# Patient Record
Sex: Male | Born: 1955 | Race: White | Hispanic: No | Marital: Married | Smoking: Never smoker
Health system: Southern US, Community
[De-identification: ages and names within clinical notes are randomized; demographics above are authoritative.]

## PROBLEM LIST (undated history)

## (undated) DIAGNOSIS — E785 Hyperlipidemia, unspecified: Secondary | ICD-10-CM

## (undated) DIAGNOSIS — J45990 Exercise induced bronchospasm: Secondary | ICD-10-CM

## (undated) DIAGNOSIS — R739 Hyperglycemia, unspecified: Secondary | ICD-10-CM

## (undated) DIAGNOSIS — Z8619 Personal history of other infectious and parasitic diseases: Secondary | ICD-10-CM

## (undated) HISTORY — DX: Hyperglycemia, unspecified: R73.9

## (undated) HISTORY — DX: Exercise induced bronchospasm: J45.990

## (undated) HISTORY — DX: Hyperlipidemia, unspecified: E78.5

## (undated) HISTORY — DX: Personal history of other infectious and parasitic diseases: Z86.19

## (undated) HISTORY — PX: TONSILLECTOMY: SHX5217

---

## 2015-11-08 DIAGNOSIS — B029 Zoster without complications: Secondary | ICD-10-CM | POA: Diagnosis not present

## 2016-03-26 DIAGNOSIS — S53492A Other sprain of left elbow, initial encounter: Secondary | ICD-10-CM | POA: Diagnosis not present

## 2016-08-18 DIAGNOSIS — M545 Low back pain: Secondary | ICD-10-CM | POA: Diagnosis not present

## 2017-07-28 DIAGNOSIS — M62838 Other muscle spasm: Secondary | ICD-10-CM | POA: Diagnosis not present

## 2017-08-15 DIAGNOSIS — M545 Low back pain: Secondary | ICD-10-CM | POA: Diagnosis not present

## 2017-08-16 ENCOUNTER — Telehealth: Payer: Self-pay | Admitting: Family Medicine

## 2017-08-16 NOTE — Telephone Encounter (Signed)
Copied from CRM [id number]. Topic: Appointment Scheduling - Scheduling Inquiry for Clinic >> Aug 16, 2017  9:22 AM Arlyss Gandyichardson, Taren N, NT wrote: Reason for CRM: Patient calling to see if Dr. Abner GreenspanBlyth will accept him as a new patient. He was referred by his neighbor, Reuban Romaro. Please advise.

## 2017-08-16 NOTE — Telephone Encounter (Signed)
I will accept as new patient

## 2017-08-17 NOTE — Telephone Encounter (Signed)
Pt scheduled for 10/15/17 @ 10:45pm

## 2017-08-20 ENCOUNTER — Ambulatory Visit (HOSPITAL_BASED_OUTPATIENT_CLINIC_OR_DEPARTMENT_OTHER)
Admission: RE | Admit: 2017-08-20 | Discharge: 2017-08-20 | Disposition: A | Discharge status: Home / Self Care | Payer: BLUE CROSS/BLUE SHIELD | Source: Ambulatory Visit | Attending: Medical | Admitting: Medical

## 2017-08-20 ENCOUNTER — Encounter: Payer: Self-pay | Admitting: Medical

## 2017-08-20 ENCOUNTER — Ambulatory Visit: Payer: BLUE CROSS/BLUE SHIELD | Admitting: Medical

## 2017-08-20 VITALS — BP 133/92 | HR 107 | Temp 98.2°F | Resp 16 | Ht 70.5 in | Wt 217.2 lb

## 2017-08-20 DIAGNOSIS — M5116 Intervertebral disc disorders with radiculopathy, lumbar region: Secondary | ICD-10-CM | POA: Diagnosis not present

## 2017-08-20 DIAGNOSIS — M5441 Lumbago with sciatica, right side: Secondary | ICD-10-CM | POA: Diagnosis not present

## 2017-08-20 DIAGNOSIS — R030 Elevated blood-pressure reading, without diagnosis of hypertension: Secondary | ICD-10-CM | POA: Diagnosis not present

## 2017-08-20 DIAGNOSIS — M25561 Pain in right knee: Secondary | ICD-10-CM | POA: Insufficient documentation

## 2017-08-20 DIAGNOSIS — M1711 Unilateral primary osteoarthritis, right knee: Secondary | ICD-10-CM | POA: Diagnosis not present

## 2017-08-20 DIAGNOSIS — M5136 Other intervertebral disc degeneration, lumbar region: Secondary | ICD-10-CM | POA: Diagnosis not present

## 2017-08-20 LAB — COMPREHENSIVE METABOLIC PANEL
ALT: 19 U/L (ref 0–53)
AST: 17 U/L (ref 0–37)
Albumin: 4.2 g/dL (ref 3.5–5.2)
Alkaline Phosphatase: 64 U/L (ref 39–117)
BUN: 19 mg/dL (ref 6–23)
CHLORIDE: 103 meq/L (ref 96–112)
CO2: 27 meq/L (ref 19–32)
CREATININE: 1.24 mg/dL (ref 0.40–1.50)
Calcium: 9.4 mg/dL (ref 8.4–10.5)
GFR: 62.62 mL/min (ref >60.00)
GLUCOSE: 109 mg/dL — AB (ref 70–99)
Potassium: 3.6 mEq/L (ref 3.5–5.1)
SODIUM: 138 meq/L (ref 135–145)
Total Bilirubin: 0.7 mg/dL (ref 0.2–1.2)
Total Protein: 7.5 g/dL (ref 6.0–8.3)

## 2017-08-20 LAB — URIC ACID: Uric Acid, Serum: 5.1 mg/dL (ref 4.0–7.8)

## 2017-08-20 MED ORDER — TRAMADOL HCL 50 MG PO TABS: 50.0000 mg | ORAL_TABLET | Freq: Four times a day (QID) | ORAL | 0 refills | Status: DC | PRN

## 2017-08-20 NOTE — Progress Notes (Signed)
Subjective:    Patient ID: Steven Goodwin, male    DOB: [birth year], 62 y.o.   MRN: [id number]  HPI  Pt in for first time.(pt eventually wants to see Dr. Abner Greenspan) Appointment in September.  He states he never has had pcp. In past only used urgent cares. On dx chronic medical problems.  He states moved [place] for 3 years. 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 Some walking for exercise. He states eats healthy, married.  Pt has history of intermittent lower back pain for years. Often will occur with lifting heavy thinks or straining. Pt has used nsaids periodically in the past. One time responded well to meloxicam.   Pt states just recently last Sunday back pain flared again after taking family to airport and lifting a lot of luggage. Later in day pain increased. Pain more rt lower back. Over last week pain has decreased. He did report at one point he had pain that was shooting down his rt leg. That was not typical feature he has had in past. Just with recent flare. No numbness to foot or saddle anesthesia.   Rt knee pain as well just recently when his back was hurting. Pain in knee at night moderate. He tried heat and ice last night. Did not help.  Pt has tried ibuprofen recently.  Knee pain is new. Back pain intermittment for years.   Every since youth he thought his exercise tolerance less than others. But has exert singnificant exericse such as jogging or swimming. No problems every with walking. No cardiac symptoms noted.  Recently has been on meloxicam and robaxin(seen by UC in [place])    Review of Systems  Constitutional: Negative for chills, fatigue and fever.  Respiratory: Negative for cough, chest tightness, shortness of breath and wheezing.   Cardiovascular: Negative for chest pain and palpitations.  Gastrointestinal: Negative for abdominal pain.  Musculoskeletal: Positive for back pain.       Rt knee pain.  Skin: Negative for rash.  Neurological: Negative for seizures, facial  asymmetry, speech difficulty, weakness and light-headedness.  Hematological: Negative for adenopathy. Does not bruise/bleed easily.  Psychiatric/Behavioral: Negative for behavioral problems and confusion.    No past medical history on file.   Social History   Socioeconomic History  . Marital status: Married    Spouse name: Not on file  . Number of children: Not on file  . Years of education: Not on file  . Highest education level: Not on file  Occupational History  . Not on file  Social Needs  . Financial resource strain: Not on file  . Food insecurity:    Worry: Not on file    Inability: Not on file  . Transportation needs:    Medical: Not on file    Non-medical: Not on file  Tobacco Use  . Smoking status: Never Smoker  . Smokeless tobacco: Never Used  Substance and Sexual Activity  . Alcohol use: Not on file  . Drug use: Not on file  . Sexual activity: Not on file  Lifestyle  . Physical activity:    Days per week: Not on file    Minutes per session: Not on file  . Stress: Not on file  Relationships  . Social connections:    Talks on phone: Not on file    Gets together: Not on file    Attends religious service: Not on file    Active member of club or organization: Not on file    Attends meetings of clubs  or organizations: Not on file    Relationship status: Not on file  . Intimate partner violence:    Fear of current or ex partner: Not on file    Emotionally abused: Not on file    Physically abused: Not on file    Forced sexual activity: Not on file  Other Topics Concern  . Not on file  Social History Narrative  . Not on file     No family history on file.  Not on File  Current Outpatient Medications on File Prior to Visit  Medication Sig Dispense Refill  . meloxicam (MOBIC) 15 MG tablet Take 15 mg by mouth daily.  0  . methocarbamol (ROBAXIN) 500 MG tablet TAKE 2 TABLETS BY MOUTH THREE TIMES A DAY AS NEEDED  0   No current facility-administered  medications on file prior to visit.     BP (!) 133/92   Pulse (!) 107   Temp 98.2 F (36.8 C) (Oral)   Resp 16   Ht 5' 10.5" (1.791 m)   Wt 217 lb 3.2 oz (98.5 kg)   SpO2 96%   BMI 30.72 kg/m       Objective:   Physical Exam  General Appearance- Not in acute distress.    Chest and Lung Exam Auscultation: Breath sounds:-Normal. Clear even and unlabored. Adventitious sounds:- No Adventitious sounds.  Cardiovascular Auscultation:Rythm - Regular, rate and rythm. Heart Sounds -Normal heart sounds.  Abdomen Inspection:-Inspection Normal.  Palpation/Perucssion: Palpation and Percussion of the abdomen reveal- Non Tender, No Rebound tenderness, No rigidity(Guarding) and No Palpable abdominal masses.  Liver:-Normal.  Spleen:- Normal.   Back  Rt side mid lumbar spine tenderness to palpation. Pain on straight leg lift rt side. Pain sitting upright. Pt has  Lumbar pain when attempts to straighten back.    Lower ext neurologic L5-S1 sensation intact bilaterally. Normal patellar reflexes bilaterally. No foot drop bilaterally.  Rt knee- no pain presently on exam. Good flexion and extension. No pain presently. No pain on palpation. Faint crepitus.     Assessment & Plan:  For your history of recent knee pain and low back pain, I placed x-rays to be done for both areas today.  I would continue the meloxicam, Robaxin and I am going to add tramadol prescription to use for intermittent severe pain if needed.(Such as last night when pain in the knee was such that she could not sleep.)  I do think is a good idea to check your metabolic panel/kidney function.  Since use of NSAIDs might be needed in the future intermittently.  Also decided to add uric acid due to your severe level pain last night.  You can try back stretching exercises as tolerated.  If you have worse signs or symptoms please let us know.  If pain persists past another week then could refer you to sports  medicine.  Follow-up in 10 to 14 days or as needed.  Esperanza RichtersEdward Morenike Cuff, PA-C

## 2017-08-20 NOTE — Patient Instructions (Addendum)
For your history of recent knee pain and low back pain, I placed x-rays to be done for both areas today.  I would continue the meloxicam, Robaxin and I am going to add tramadol prescription to use for intermittent severe pain if needed.(Such as last night when pain in the knee was such that she could not sleep.)  I do think is a good idea to check your metabolic panel/kidney function.  Since use of NSAIDs might be needed in the future intermittently.  Also decided to add uric acid due to your severe level pain last night.  You can try back stretching exercises as tolerated.  If you have worse signs or symptoms please let us know.  If pain persists past another week then could refer you to sports medicine.  Follow-up in 10 to 14 days or as needed.   Back Exercises If you have pain in your back, do these exercises 2-3 times each day or as told by your doctor. When the pain goes away, do the exercises once each day, but repeat the steps more times for each exercise (do more repetitions). If you do not have pain in your back, do these exercises once each day or as told by your doctor. Exercises Single Knee to Chest  Do these steps 3-5 times in a row for each leg: 1. Lie on your back on a firm bed or the floor with your legs stretched out. 2. Bring one knee to your chest. 3. Hold your knee to your chest by grabbing your knee or thigh. 4. Pull on your knee until you feel a gentle stretch in your lower back. 5. Keep doing the stretch for 10-30 seconds. 6. Slowly let go of your leg and straighten it.  Pelvic Tilt  Do these steps 5-10 times in a row: 1. Lie on your back on a firm bed or the floor with your legs stretched out. 2. Bend your knees so they point up to the ceiling. Your feet should be flat on the floor. 3. Tighten your lower belly (abdomen) muscles to press your lower back against the floor. This will make your tailbone point up to the ceiling instead of pointing down to your  feet or the floor. 4. Stay in this position for 5-10 seconds while you gently tighten your muscles and breathe evenly.  Cat-Cow  Do these steps until your lower back bends more easily: 1. Get on your hands and knees on a firm surface. Keep your hands under your shoulders, and keep your knees under your hips. You may put padding under your knees. 2. Let your head hang down, and make your tailbone point down to the floor so your lower back is round like the back of a cat. 3. Stay in this position for 5 seconds. 4. Slowly lift your head and make your tailbone point up to the ceiling so your back hangs low (sags) like the back of a cow. 5. Stay in this position for 5 seconds.  Press-Ups  Do these steps 5-10 times in a row: 1. Lie on your belly (face-down) on the floor. 2. Place your hands near your head, about shoulder-width apart. 3. While you keep your back relaxed and keep your hips on the floor, slowly straighten your arms to raise the top half of your body and lift your shoulders. Do not use your back muscles. To make yourself more comfortable, you may change where you place your hands. 4. Stay in this position for 5  seconds. 5. Slowly return to lying flat on the floor.  Bridges  Do these steps 10 times in a row: 1. Lie on your back on a firm surface. 2. Bend your knees so they point up to the ceiling. Your feet should be flat on the floor. 3. Tighten your butt muscles and lift your butt off of the floor until your waist is almost as high as your knees. If you do not feel the muscles working in your butt and the back of your thighs, slide your feet 1-2 inches farther away from your butt. 4. Stay in this position for 3-5 seconds. 5. Slowly lower your butt to the floor, and let your butt muscles relax.  If this exercise is too easy, try doing it with your arms crossed over your chest. Belly Crunches  Do these steps 5-10 times in a row: 1. Lie on your back on a firm bed or the floor  with your legs stretched out. 2. Bend your knees so they point up to the ceiling. Your feet should be flat on the floor. 3. Cross your arms over your chest. 4. Tip your chin a little bit toward your chest but do not bend your neck. 5. Tighten your belly muscles and slowly raise your chest just enough to lift your shoulder blades a tiny bit off of the floor. 6. Slowly lower your chest and your head to the floor.  Back Lifts Do these steps 5-10 times in a row: 1. Lie on your belly (face-down) with your arms at your sides, and rest your forehead on the floor. 2. Tighten the muscles in your legs and your butt. 3. Slowly lift your chest off of the floor while you keep your hips on the floor. Keep the back of your head in line with the curve in your back. Look at the floor while you do this. 4. Stay in this position for 3-5 seconds. 5. Slowly lower your chest and your face to the floor.  Contact a doctor if:  Your back pain gets a lot worse when you do an exercise.  Your back pain does not lessen 2 hours after you exercise. If you have any of these problems, stop doing the exercises. Do not do them again unless your doctor says it is okay. Get help right away if:  You have sudden, very bad back pain. If this happens, stop doing the exercises. Do not do them again unless your doctor says it is okay. This information is not intended to replace advice given to you by your health care provider. Make sure you discuss any questions you have with your health care provider. Document Released: 02/21/2010 Document Revised: 06/27/2015 Document Reviewed: 03/15/2014 Elsevier Interactive Patient Education  [employer].

## 2017-10-15 ENCOUNTER — Ambulatory Visit: Payer: BLUE CROSS/BLUE SHIELD | Admitting: Family Medicine

## 2017-10-26 ENCOUNTER — Ambulatory Visit: Payer: BLUE CROSS/BLUE SHIELD | Admitting: Family Medicine

## 2017-11-01 ENCOUNTER — Ambulatory Visit: Payer: BLUE CROSS/BLUE SHIELD | Admitting: Family Medicine

## 2017-11-01 ENCOUNTER — Encounter: Payer: Self-pay | Admitting: Family Medicine

## 2017-11-01 VITALS — BP 120/82 | HR 83 | Temp 97.5°F | Resp 18 | Ht 70.0 in | Wt 217.0 lb

## 2017-11-01 DIAGNOSIS — J45990 Exercise induced bronchospasm: Secondary | ICD-10-CM | POA: Insufficient documentation

## 2017-11-01 DIAGNOSIS — M545 Low back pain, unspecified: Secondary | ICD-10-CM

## 2017-11-01 DIAGNOSIS — Z7289 Other problems related to lifestyle: Secondary | ICD-10-CM

## 2017-11-01 DIAGNOSIS — E785 Hyperlipidemia, unspecified: Secondary | ICD-10-CM | POA: Insufficient documentation

## 2017-11-01 DIAGNOSIS — M1990 Unspecified osteoarthritis, unspecified site: Secondary | ICD-10-CM | POA: Insufficient documentation

## 2017-11-01 DIAGNOSIS — Z0000 Encounter for general adult medical examination without abnormal findings: Secondary | ICD-10-CM | POA: Diagnosis not present

## 2017-11-01 DIAGNOSIS — Z8619 Personal history of other infectious and parasitic diseases: Secondary | ICD-10-CM | POA: Insufficient documentation

## 2017-11-01 DIAGNOSIS — Z23 Encounter for immunization: Secondary | ICD-10-CM

## 2017-11-01 DIAGNOSIS — R739 Hyperglycemia, unspecified: Secondary | ICD-10-CM | POA: Diagnosis not present

## 2017-11-01 DIAGNOSIS — M79604 Pain in right leg: Secondary | ICD-10-CM | POA: Insufficient documentation

## 2017-11-01 HISTORY — DX: Hyperlipidemia, unspecified: E78.5

## 2017-11-01 HISTORY — DX: Exercise induced bronchospasm: J45.990

## 2017-11-01 MED ORDER — ALBUTEROL SULFATE HFA 108 (90 BASE) MCG/ACT IN AERS: 2.0000 | INHALATION_SPRAY | Freq: Four times a day (QID) | RESPIRATORY_TRACT | 1 refills | Status: DC | PRN

## 2017-11-01 MED ORDER — METHOCARBAMOL 500 MG PO TABS: ORAL_TABLET | ORAL | 0 refills | Status: DC

## 2017-11-01 NOTE — Assessment & Plan Note (Addendum)
Low back and knees, encouraged to stay active as toleated

## 2017-11-01 NOTE — Assessment & Plan Note (Addendum)
Patient encouraged to maintain heart healthy diet, regular exercise, adequate sleep. Consider daily probiotics. Take medications as prescribed. Check labs today. Given and reviewed copy of ACP documents from [place] Secretary of State and encouraged to complete and return 

## 2017-11-01 NOTE — Progress Notes (Signed)
Subjective:    Patient ID: Steven Goodwin, male    DOB: Sep 06, 1955, 62 y.o.   MRN: [id number]  Chief Complaint  Patient presents with  . Establish Care    HPI Patient is in today for annual preventative exam and evaluation of hyperglycemia and hyperlipidemia which he says was confirmed at work. He has not historically seen doctors much and is realizing he needs a primary care doctor as he ages. He says the last time he saw a doctor for preventative purposes was at age 89 when he went to a gastroenterologist in [place] for a colonoscopy and some lab work. He reports if was good and is willing to sign a release of records to get a copy. No trouble with his bowels at this time. He continues to struggle with low back pain and some radicular symptoms into right leg for several months now. No incontinence and the symptoms have improved some just not resolved. No obvious injury or fall. He struggles with bilateral knee pain and stiffness as well. Denies CP/palp/SOB/HA/congestion/fevers/GI or GU c/o. Taking meds as prescribed  Past Medical History:  Diagnosis Date  . Exercise-induced asthma 11/01/2017  . History of chicken pox   . History of shingles   . Hyperglycemia   . Hyperlipidemia 11/01/2017    Past Surgical History:  Procedure Laterality Date  . TONSILLECTOMY     childhood    Family History  Problem Relation Age of Onset  . Heart failure Mother   . Heart failure Father   . Mental illness Sister     Social History   Socioeconomic History  . Marital status: Married    Spouse name: Not on file  . Number of children: Not on file  . Years of education: Not on file  . Highest education level: Not on file  Occupational History  . Not on file  Social Needs  . Financial resource strain: Not on file  . Food insecurity:    Worry: Not on file    Inability: Not on file  . Transportation needs:    Medical: Not on file    Non-medical: Not on file  Tobacco Use  . Smoking status: Never  Smoker  . Smokeless tobacco: Never Used  Substance and Sexual Activity  . Alcohol use: Not Currently    Frequency: Never  . Drug use: Never  . Sexual activity: Yes  Lifestyle  . Physical activity:    Days per week: Not on file    Minutes per session: Not on file  . Stress: Not on file  Relationships  . Social connections:    Talks on phone: Not on file    Gets together: Not on file    Attends religious service: Not on file    Active member of club or organization: Not on file    Attends meetings of clubs or organizations: Not on file    Relationship status: Not on file  . Intimate partner violence:    Fear of current or ex partner: Not on file    Emotionally abused: Not on file    Physically abused: Not on file    Forced sexual activity: Not on file  Other Topics Concern  . Not on file  Social History Narrative  . Not on file    Outpatient Medications Prior to Visit  Medication Sig Dispense Refill  . meloxicam (MOBIC) 15 MG tablet Take 15 mg by mouth daily.  0  . methocarbamol (ROBAXIN) 500 MG tablet TAKE  2 TABLETS BY MOUTH THREE TIMES A DAY AS NEEDED  0  . traMADol (ULTRAM) 50 MG tablet Take 1 tablet (50 mg total) by mouth every 6 (six) hours as needed. 15 tablet 0   No facility-administered medications prior to visit.     Not on File  Review of Systems  Constitutional: Negative for chills, fever and malaise/fatigue.  HENT: Negative for congestion and hearing loss.   Eyes: Negative for discharge.  Respiratory: Negative for cough, sputum production and shortness of breath.   Cardiovascular: Negative for chest pain, palpitations and leg swelling.  Gastrointestinal: Negative for abdominal pain, blood in stool, constipation, diarrhea, heartburn, nausea and vomiting.  Genitourinary: Negative for dysuria, frequency, hematuria and urgency.  Musculoskeletal: Positive for back pain and joint pain. Negative for falls and myalgias.  Skin: Negative for rash.  Neurological:  Negative for dizziness, sensory change, loss of consciousness, weakness and headaches.  Endo/Heme/Allergies: Negative for environmental allergies. Does not bruise/bleed easily.  Psychiatric/Behavioral: Negative for depression and suicidal ideas. The patient is not nervous/anxious and does not have insomnia.        Objective:    Physical Exam  Constitutional: He is oriented to person, place, and time. He appears well-developed and well-nourished. No distress.  HENT:  Head: Normocephalic and atraumatic.  Right Ear: External ear normal.  Left Ear: External ear normal.  Nose: Nose normal.  Mouth/Throat: Oropharynx is clear and moist.  Eyes: Pupils are equal, round, and reactive to light. Conjunctivae and EOM are normal. Right eye exhibits no discharge. Left eye exhibits no discharge.  Neck: Normal range of motion. Neck supple. No thyromegaly present.  Cardiovascular: Normal rate and regular rhythm.  No murmur heard. Pulmonary/Chest: Effort normal and breath sounds normal. He has no wheezes.  Abdominal: Soft. Bowel sounds are normal. He exhibits no distension and no mass. There is no tenderness. There is no rebound and no guarding.  Musculoskeletal: Normal range of motion. He exhibits no edema.  Lymphadenopathy:    He has no cervical adenopathy.  Neurological: He is alert and oriented to person, place, and time. He displays normal reflexes. No cranial nerve deficit or sensory deficit. Coordination normal.  Skin: Skin is warm and dry. No erythema.  Psychiatric: He has a normal mood and affect.  Nursing note and vitals reviewed.   BP 120/82 (BP Location: Left Arm, Patient Position: Sitting, Cuff Size: Normal)   Pulse 83   Temp (!) 97.5 F (36.4 C) (Oral)   Resp 18   Ht 5\' 10"  (1.778 m)   Wt 217 lb (98.4 kg)   SpO2 98%   BMI 31.14 kg/m  Wt Readings from Last 3 Encounters:  11/01/17 217 lb (98.4 kg)  08/20/17 217 lb 3.2 oz (98.5 kg)     Lab Results  Component Value Date    GLUCOSE 109 (H) 08/20/2017   ALT 19 08/20/2017   AST 17 08/20/2017   NA 138 08/20/2017   K 3.6 08/20/2017   CL 103 08/20/2017   CREATININE 1.24 08/20/2017   BUN 19 08/20/2017   CO2 27 08/20/2017    No results found for: TSH No results found for: WBC, HGB, HCT, MCV, PLT Lab Results  Component Value Date   NA 138 08/20/2017   K 3.6 08/20/2017   CO2 27 08/20/2017   GLUCOSE 109 (H) 08/20/2017   BUN 19 08/20/2017   CREATININE 1.24 08/20/2017   BILITOT 0.7 08/20/2017   ALKPHOS 64 08/20/2017   AST 17 08/20/2017   ALT  19 08/20/2017   PROT 7.5 08/20/2017   ALBUMIN 4.2 08/20/2017   CALCIUM 9.4 08/20/2017   GFR 62.62 08/20/2017   No results found for: CHOL No results found for: HDL No results found for: LDLCALC No results found for: TRIG No results found for: CHOLHDL No results found for: ZOXW9U     Assessment & Plan:   Problem List Items Addressed This Visit    History of shingles    He agrees to Shingrix shots but we do not have anymore in the office today. He can return for shot in future or receive at pharmacy      Hyperglycemia    hgba1c acceptable, minimize simple carbs. Increase exercise as tolerated.       Relevant Orders   Hemoglobin A1c   Comprehensive metabolic panel   TSH   History of chicken pox   Exercise-induced asthma    Given an rx for Albuterol to use prn      Relevant Medications   albuterol (PROVENTIL HFA;VENTOLIN HFA) 108 (90 Base) MCG/ACT inhaler   Osteoarthritis    Low back and knees, encouraged to stay active as toleated      Relevant Medications   methocarbamol (ROBAXIN) 500 MG tablet   Low back pain radiating to right leg    Ambulatory referral to sports med for 2.5 months of low back pain with right lower extremity radiculopathy. Using NSAIDs and methocarbimol has helped some but symptoms have not fully resolved      Relevant Medications   methocarbamol (ROBAXIN) 500 MG tablet   Other Relevant Orders   Ambulatory referral to  Sports Medicine   CBC   Urine Microalbumin w/creat. ratio   Preventative health care - Primary    Patient encouraged to maintain heart healthy diet, regular exercise, adequate sleep. Consider daily probiotics. Take medications as prescribed. Check labs today. Given and reviewed copy of ACP documents from [place] Secretary of State and encouraged to complete and return      Relevant Orders   CBC   Comprehensive metabolic panel   TSH   PSA   Urine Microalbumin w/creat. ratio   Hyperlipidemia   Relevant Orders   Lipid panel    Other Visit Diagnoses    Other problems related to lifestyle       Relevant Orders   Hepatitis C antibody      I have discontinued Steven Goodwin's meloxicam and traMADol. I have also changed his methocarbamol. Additionally, I am having him start on albuterol.  Meds ordered this encounter  Medications  . albuterol (PROVENTIL HFA;VENTOLIN HFA) 108 (90 Base) MCG/ACT inhaler    Sig: Inhale 2 puffs into the lungs every 6 (six) hours as needed for wheezing or shortness of breath (prior to exertion).    Dispense:  1 Inhaler    Refill:  1  . methocarbamol (ROBAXIN) 500 MG tablet    Sig: TAKE 1 TABLET BY MOUTH qhs AS NEEDED    Dispense:  30 tablet    Refill:  0     Danise Edge, MD

## 2017-11-01 NOTE — Assessment & Plan Note (Addendum)
Ambulatory referral to sports med for 2.5 months of low back pain with right lower extremity radiculopathy. Using NSAIDs and methocarbimol has helped some but symptoms have not fully resolved

## 2017-11-01 NOTE — Assessment & Plan Note (Signed)
Given an rx for Albuterol to use prn

## 2017-11-01 NOTE — Patient Instructions (Signed)

## 2017-11-01 NOTE — Assessment & Plan Note (Signed)
hgba1c acceptable, minimize simple carbs. Increase exercise as tolerated.  

## 2017-11-01 NOTE — Assessment & Plan Note (Signed)
He agrees to Shingrix shots but we do not have anymore in the office today. He can return for shot in future or receive at pharmacy

## 2017-11-02 LAB — COMPREHENSIVE METABOLIC PANEL
ALBUMIN: 4.2 g/dL (ref 3.5–5.2)
ALK PHOS: 62 U/L (ref 39–117)
ALT: 18 U/L (ref 0–53)
AST: 18 U/L (ref 0–37)
BILIRUBIN TOTAL: 0.5 mg/dL (ref 0.2–1.2)
BUN: 16 mg/dL (ref 6–23)
CALCIUM: 9.4 mg/dL (ref 8.4–10.5)
CHLORIDE: 103 meq/L (ref 96–112)
CO2: 27 mEq/L (ref 19–32)
CREATININE: 1.13 mg/dL (ref 0.40–1.50)
GFR: 69.66 mL/min (ref >60.00)
Glucose, Bld: 88 mg/dL (ref 70–99)
Potassium: 4.2 mEq/L (ref 3.5–5.1)
SODIUM: 138 meq/L (ref 135–145)
TOTAL PROTEIN: 6.9 g/dL (ref 6.0–8.3)

## 2017-11-02 LAB — CBC
HCT: 44.9 % (ref 39.0–52.0)
Hemoglobin: 15.4 g/dL (ref 13.0–17.0)
MCHC: 34.2 g/dL (ref 30.0–36.0)
MCV: 87.7 fl (ref 78.0–100.0)
PLATELETS: 275 10*3/uL (ref 150.0–400.0)
RBC: 5.11 Mil/uL (ref 4.22–5.81)
RDW: 13.9 % (ref 11.5–15.5)
WBC: 7.3 10*3/uL (ref 4.0–10.5)

## 2017-11-02 LAB — LIPID PANEL
CHOLESTEROL: 149 mg/dL (ref 0–200)
HDL: 40.4 mg/dL (ref >39.00)
LDL CALC: 82 mg/dL (ref 0–99)
NonHDL: 108.77
TRIGLYCERIDES: 133 mg/dL (ref 0.0–149.0)
Total CHOL/HDL Ratio: 4
VLDL: 26.6 mg/dL (ref 0.0–40.0)

## 2017-11-02 LAB — HEPATITIS C ANTIBODY
Hepatitis C Ab: NONREACTIVE
SIGNAL TO CUT-OFF: 0.08 (ref <1.00)

## 2017-11-02 LAB — TSH: TSH: 3.54 u[IU]/mL (ref 0.35–4.50)

## 2017-11-02 LAB — MICROALBUMIN / CREATININE URINE RATIO
Creatinine,U: 75.7 mg/dL
Microalb Creat Ratio: 0.9 mg/g (ref 0.0–30.0)
Microalb, Ur: <0.7 mg/dL (ref 0.0–1.9)

## 2017-11-02 LAB — PSA: PSA: 1.29 ng/mL (ref 0.10–4.00)

## 2017-11-02 LAB — HEMOGLOBIN A1C: Hgb A1c MFr Bld: 5.6 % (ref 4.6–6.5)

## 2017-11-10 ENCOUNTER — Encounter: Payer: Self-pay | Admitting: Family Medicine

## 2017-11-30 NOTE — Progress Notes (Signed)
[name] [address] [place], [place] [address] Phone: [phone] Subjective:   Steven Goodwin, am serving as a scribe for Dr. Antoine Primas.   CC: Right leg pain  OZH:[id number]  Steven Goodwin is a 62 y.o. male coming in with complaint of back and right leg pain. Pain radiates into the right quad. Began on July 13th when he was traveling and handling a lot of luggage. Pain for 3 months. Pain has improved. Standing up straight caused a lot of pain initially. Has had pain like this before. Has used ice, heat, stretching, chiropractic care, inversion table.  Patient states slowly and has seemed to get better but is wanting to know what else he can potentially do.  Patient saw another provider in July did have x-rays done.  X-rays did show that patient did have a mild to moderate degenerative disc disease at L2-L3 but otherwise unremarkable.    Past Medical History:  Diagnosis Date  . Exercise-induced asthma 11/01/2017  . History of chicken pox   . History of shingles   . Hyperglycemia   . Hyperlipidemia 11/01/2017   Past Surgical History:  Procedure Laterality Date  . TONSILLECTOMY     childhood   Social History   Socioeconomic History  . Marital status: Married    Spouse name: Not on file  . Number of children: Not on file  . Years of education: Not on file  . Highest education level: Not on file  Occupational History  . Not on file  Social Needs  . Financial resource strain: Not on file  . Food insecurity:    Worry: Not on file    Inability: Not on file  . Transportation needs:    Medical: Not on file    Non-medical: Not on file  Tobacco Use  . Smoking status: Never Smoker  . Smokeless tobacco: Never Used  Substance and Sexual Activity  . Alcohol use: Not Currently    Frequency: Never  . Drug use: Never  . Sexual activity: Yes  Lifestyle  . Physical activity:    Days per week: Not on file    Minutes per session: Not on file  .  Stress: Not on file  Relationships  . Social connections:    Talks on phone: Not on file    Gets together: Not on file    Attends religious service: Not on file    Active member of club or organization: Not on file    Attends meetings of clubs or organizations: Not on file    Relationship status: Not on file  Other Topics Concern  . Not on file  Social History Narrative  . Not on file   Not on File Family History  Problem Relation Age of Onset  . Heart failure Mother   . Heart failure Father   . Mental illness Sister       Current Outpatient Medications (Respiratory):  .  albuterol (PROVENTIL HFA;VENTOLIN HFA) 108 (90 Base) MCG/ACT inhaler, Inhale 2 puffs into the lungs every 6 (six) hours as needed for wheezing or shortness of breath (prior to exertion).    Current Outpatient Medications (Other):  .  methocarbamol (ROBAXIN) 500 MG tablet, TAKE 1 TABLET BY MOUTH qhs AS NEEDED .  gabapentin (NEURONTIN) 100 MG capsule, Take 2 capsules (200 mg total) by mouth at bedtime.    Past medical history, social, surgical and family history all reviewed in electronic medical record.  No pertanent information  unless stated regarding to the chief complaint.   Review of Systems:  No headache, visual changes, nausea, vomiting, diarrhea, constipation, dizziness, abdominal pain, skin rash, fevers, chills, night sweats, weight loss, swollen lymph nodes, body aches, joint swelling, , chest pain, shortness of breath, mood changes.  Positive muscle aches  Objective  Blood pressure 112/70, pulse 65, height 5\' 10"  (1.778 m), weight 217 lb (98.4 kg), SpO2 95 %.    General: No apparent distress alert and oriented x3 mood and affect normal, dressed appropriately.  HEENT: Pupils equal, extraocular movements intact  Respiratory: Patient's speak in full sentences and does not appear short of breath  Cardiovascular: No lower extremity edema, non tender, no erythema  Skin: Warm dry intact with no  signs of infection or rash on extremities or on axial skeleton.  Abdomen: Soft nontender  Neuro: Cranial nerves II through XII are intact, neurovascularly intact in all extremities with 2+ DTRs and 2+ pulses.  Lymph: No lymphadenopathy of posterior or anterior cervical chain or axillae bilaterally.  Gait antalgic MSK:  Non tender with full range of motion and good stability and symmetric strength and tone of shoulders, elbows, wrist, hip, knee and ankles bilaterally.   Loss of lordosis of the lumbar spine.  Tightness noted in the paraspinal musculature.  Mild positive Pearlean Brownie test on the right.  Tenderness to more in the thoracolumbar juncture.  Negative straight leg test.  5 out of 5 strength of the lower extremity.  [id number]; 15 additional minutes spent for Therapeutic exercises as stated in above notes.  This included exercises focusing on stretching, strengthening, with significant focus on eccentric aspects.   Long term goals include an improvement in range of motion, strength, endurance as well as avoiding reinjury. Patient's frequency would include in 1-2 times a day, 3-5 times a week for a duration of 6-12 weeks.  Proper technique shown and discussed handout in great detail with ATC.  All questions were discussed and answered.     Impression and Recommendations:     This case required medical decision making of moderate complexity. The above documentation has been reviewed and is accurate and complete Judi Saa, DO       Note: This dictation was prepared with Dragon dictation along with smaller phrase technology. Any transcriptional errors that result from this process are unintentional.

## 2017-12-01 ENCOUNTER — Ambulatory Visit (INDEPENDENT_AMBULATORY_CARE_PROVIDER_SITE_OTHER): Payer: BLUE CROSS/BLUE SHIELD | Admitting: Family Medicine

## 2017-12-01 DIAGNOSIS — M545 Low back pain: Secondary | ICD-10-CM | POA: Diagnosis not present

## 2017-12-01 DIAGNOSIS — M79604 Pain in right leg: Secondary | ICD-10-CM

## 2017-12-01 MED ORDER — GABAPENTIN 100 MG PO CAPS: 200.0000 mg | ORAL_CAPSULE | Freq: Every day | ORAL | 3 refills | Status: DC

## 2017-12-01 NOTE — Patient Instructions (Addendum)
Good to see you  Gabapentin 200mg  at night if needed Exercises 3 times a week.  Stay active [employer] "total support" online would be great  Over the counter get  Turmeric 500mg  daily  Tart cherry extract any dose at night Vitamin D 2000 IU daily  See me again in 5-6 weeks if questions or concerns

## 2017-12-01 NOTE — Assessment & Plan Note (Signed)
Patient's radiation does seem to be anterior into the quadricep.  This does correspond with the mild to moderate arthritic changes noted at the L2-L3 area.  I believe that going after patient's hip flexors and stretching with strengthening hip abductors could be beneficial.  Discussed icing regimen over-the-counter medications, work with athletic trainer to learn home exercises patient will continue to be active.  Follow-up with me again in 4 to 6 weeks

## 2018-01-09 NOTE — Progress Notes (Signed)
[facility] Scale[facility] Thoms Barthelemy D.O. [facility] [address] [place], [place] [address] Phone: [phone] Subjective:     CC: Back and right leg pain  GNF:[id number]:Subjective  Steven GeraldsReed Goodwin is a 62 y.o. male coming in with complaint of back and right leg pain. States that he is doing a lot better. Back is still a little painful.  Patient would state that approximately 30% better.       Past Medical History:  Diagnosis Date  . Exercise-induced asthma 11/01/2017  . History of chicken pox   . History of shingles   . Hyperglycemia   . Hyperlipidemia 11/01/2017   Past Surgical History:  Procedure Laterality Date  . TONSILLECTOMY     childhood   Social History   Socioeconomic History  . Marital status: Married    Spouse name: Not on file  . Number of children: Not on file  . Years of education: Not on file  . Highest education level: Not on file  Occupational History  . Not on file  Social Needs  . Financial resource strain: Not on file  . Food insecurity:    Worry: Not on file    Inability: Not on file  . Transportation needs:    Medical: Not on file    Non-medical: Not on file  Tobacco Use  . Smoking status: Never Smoker  . Smokeless tobacco: Never Used  Substance and Sexual Activity  . Alcohol use: Not Currently    Frequency: Never  . Drug use: Never  . Sexual activity: Yes  Lifestyle  . Physical activity:    Days per week: Not on file    Minutes per session: Not on file  . Stress: Not on file  Relationships  . Social connections:    Talks on phone: Not on file    Gets together: Not on file    Attends religious service: Not on file    Active member of club or organization: Not on file    Attends meetings of clubs or organizations: Not on file    Relationship status: Not on file  Other Topics Concern  . Not on file  Social History Narrative  . Not on file   Not on File Family History  Problem Relation Age of Onset  . Heart failure Mother   . Heart failure  Father   . Mental illness Sister       Current Outpatient Medications (Respiratory):  .  albuterol (PROVENTIL HFA;VENTOLIN HFA) 108 (90 Base) MCG/ACT inhaler, Inhale 2 puffs into the lungs every 6 (six) hours as needed for wheezing or shortness of breath (prior to exertion).    Current Outpatient Medications (Other):  .  gabapentin (NEURONTIN) 100 MG capsule, Take 2 capsules (200 mg total) by mouth at bedtime. .  methocarbamol (ROBAXIN) 500 MG tablet, TAKE 1 TABLET BY MOUTH qhs AS NEEDED    Past medical history, social, surgical and family history all reviewed in electronic medical record.  No pertanent information unless stated regarding to the chief complaint.   Review of Systems:  No headache, visual changes, nausea, vomiting, diarrhea, constipation, dizziness, abdominal pain, skin rash, fevers, chills, night sweats, weight loss, swollen lymph nodes, body aches, joint swelling,  chest pain, shortness of breath, mood changes.  Positive muscle aches  Objective  Blood pressure 120/82, pulse 84, height 5\' 10"  (1.778 m), weight 217 lb (98.4 kg), SpO2 98 %.    General: No apparent distress alert and oriented x3 mood and affect normal, dressed  appropriately.  HEENT: Pupils equal, extraocular movements intact  Respiratory: Patient's speak in full sentences and does not appear short of breath  Cardiovascular: No lower extremity edema, non tender, no erythema  Skin: Warm dry intact with no signs of infection or rash on extremities or on axial skeleton.  Abdomen: Soft nontender  Neuro: Cranial nerves II through XII are intact, neurovascularly intact in all extremities with 2+ DTRs and 2+ pulses.  Lymph: No lymphadenopathy of posterior or anterior cervical chain or axillae bilaterally.  Gait normal with good balance and coordination.  MSK:  Non tender with full range of motion and good stability and symmetric strength and tone of shoulders, elbows, wrist, hip, knee and ankles bilaterally.    Back Exam:  Inspection: Mild loss of lordosis Motion: Flexion 45 deg, Extension 25 deg, Side Bending to 45 deg bilaterally,  Rotation to 45 deg bilaterally  SLR laying: Negative  XSLR laying: Negative  Palpable tenderness: Tender to palpation the paraspinal musculature lumbar spine right greater than left. FABER: Positive Faber bilaterally. Sensory change: Gross sensation intact to all lumbar and sacral dermatomes.  Reflexes: 2+ at both patellar tendons, 2+ at achilles tendons, Babinski's downgoing.  Strength at foot  Plantar-flexion: 5/5 Dorsi-flexion: 5/5 Eversion: 5/5 Inversion: 5/5  Leg strength  Quad: 5/5 Hamstring: 5/5 Hip flexor: 5/5 Hip abductors: 4/5 but symmetric  Osteopathic findings  T9 extended rotated and side bent left L2 flexed rotated and side bent right Sacrum right on right    Impression and Recommendations:     This case required medical decision making of moderate complexity. The above documentation has been reviewed and is accurate and complete Judi Saa, DO       Note: This dictation was prepared with Dragon dictation along with smaller phrase technology. Any transcriptional errors that result from this process are unintentional.

## 2018-01-10 ENCOUNTER — Encounter: Payer: Self-pay | Admitting: Family Medicine

## 2018-01-10 ENCOUNTER — Ambulatory Visit: Payer: BLUE CROSS/BLUE SHIELD | Admitting: Family Medicine

## 2018-01-10 VITALS — BP 120/82 | HR 84 | Ht 70.0 in | Wt 217.0 lb

## 2018-01-10 DIAGNOSIS — M999 Biomechanical lesion, unspecified: Secondary | ICD-10-CM | POA: Diagnosis not present

## 2018-01-10 DIAGNOSIS — M545 Low back pain, unspecified: Secondary | ICD-10-CM

## 2018-01-10 DIAGNOSIS — M79604 Pain in right leg: Secondary | ICD-10-CM

## 2018-01-10 NOTE — Assessment & Plan Note (Addendum)
Doing better.  Discussed icing regimen and home exercises.  Discussed which activities to do which wants to avoid.  Discussed core strengthening.  Attempted osteopathic manipulation today.  Patient tolerated the procedure well.  Patient has gabapentin as well for bedtime and encourage him to try that on a more regular basis.  Follow-up with me again in 4 to 6 weeks.

## 2018-01-10 NOTE — Patient Instructions (Signed)
Good to see you  Ice is your friend Stay active Tried manipulation today  Continue everything else See me again in 4 weeks

## 2018-01-10 NOTE — Assessment & Plan Note (Signed)
Decision today to treat with OMT was based on Physical Exam  After verbal consent patient was treated with HVLA, ME, FPR techniques in  thoracic, lumbar and sacral areas  Patient tolerated the procedure well with improvement in symptoms  Patient given exercises, stretches and lifestyle modifications  See medications in patient instructions if given  Patient will follow up in 4-6 weeks 

## 2018-01-25 ENCOUNTER — Other Ambulatory Visit: Payer: Self-pay | Admitting: Family Medicine

## 2018-02-16 NOTE — Progress Notes (Signed)
[facility] Briella Goodwin D.O. [facility] [address] [place], [place] [address] Phone: [phone] Subjective:    I Steven NighKana Goodwin am serving as a Neurosurgeonscribe for Dr. Antoine PrimasZachary Diallo Goodwin.   CC: Back pain with radiation follow-up  OZH:[id number]:Subjective  Steven Goodwin is a 63 y.o. male coming in with complaint of right leg pain. States that he is doing a lot better.  Patient states 85% better.  Not having the radicular symptoms anymore.  More of a dull aching sensation of the lower back that is always aware of but no significant pain.  Patient helped his son move significant amount of stuff from the floor to high shelves and had no significant discomfort.  Has misplaced the exercises though and wants to make sure he is not forgetting any at this time.      Past Medical History:  Diagnosis Date  . Exercise-induced asthma 11/01/2017  . History of chicken pox   . History of shingles   . Hyperglycemia   . Hyperlipidemia 11/01/2017   Past Surgical History:  Procedure Laterality Date  . TONSILLECTOMY     childhood   Social History   Socioeconomic History  . Marital status: Married    Spouse name: Not on file  . Number of children: Not on file  . Years of education: Not on file  . Highest education level: Not on file  Occupational History  . Not on file  Social Needs  . Financial resource strain: Not on file  . Food insecurity:    Worry: Not on file    Inability: Not on file  . Transportation needs:    Medical: Not on file    Non-medical: Not on file  Tobacco Use  . Smoking status: Never Smoker  . Smokeless tobacco: Never Used  Substance and Sexual Activity  . Alcohol use: Not Currently    Frequency: Never  . Drug use: Never  . Sexual activity: Yes  Lifestyle  . Physical activity:    Days per week: Not on file    Minutes per session: Not on file  . Stress: Not on file  Relationships  . Social connections:    Talks on phone: Not on file    Gets together: Not on file    Attends  religious service: Not on file    Active member of club or organization: Not on file    Attends meetings of clubs or organizations: Not on file    Relationship status: Not on file  Other Topics Concern  . Not on file  Social History Narrative  . Not on file   Not on File Family History  Problem Relation Age of Onset  . Heart failure Mother   . Heart failure Father   . Mental illness Sister       Current Outpatient Medications (Respiratory):  .  albuterol (PROVENTIL HFA;VENTOLIN HFA) 108 (90 Base) MCG/ACT inhaler, PLEASE SEE ATTACHED FOR DETAILED DIRECTIONS    Current Outpatient Medications (Other):  .  gabapentin (NEURONTIN) 100 MG capsule, Take 2 capsules (200 mg total) by mouth at bedtime. .  methocarbamol (ROBAXIN) 500 MG tablet, TAKE 1 TABLET BY MOUTH qhs AS NEEDED    Past medical history, social, surgical and family history all reviewed in electronic medical record.  No pertanent information unless stated regarding to the chief complaint.   Review of Systems:  No headache, visual changes, nausea, vomiting, diarrhea, constipation, dizziness, abdominal pain, skin rash, fevers, chills, night sweats, weight loss, swollen lymph nodes,  body aches, joint swelling,, chest pain, shortness of breath, mood changes.  Positive muscle aches  Objective  Blood pressure 110/78, pulse 80, height 5\' 10"  (1.778 m), weight 218 lb (98.9 kg), SpO2 95 %.    General: No apparent distress alert and oriented x3 mood and affect normal, dressed appropriately.  HEENT: Pupils equal, extraocular movements intact  Respiratory: Patient's speak in full sentences and does not appear short of breath  Cardiovascular: No lower extremity edema, non tender, no erythema  Skin: Warm dry intact with no signs of infection or rash on extremities or on axial skeleton.  Abdomen: Soft nontender  Neuro: Cranial nerves II through XII are intact, neurovascularly intact in all extremities with 2+ DTRs and 2+ pulses.    Lymph: No lymphadenopathy of posterior or anterior cervical chain or axillae bilaterally.  Gait normal with good balance and coordination.  MSK:  Non tender with full range of motion and good stability and symmetric strength and tone of shoulders, elbows, wrist, hip, knee and ankles bilaterally.  Back Exam:  Inspection: Unremarkable  Motion: Flexion 45 deg, Extension 25 deg, Side Bending to 35 deg bilaterally,  Rotation to 45 deg bilaterally  SLR laying: Negative  XSLR laying: Negative  Palpable tenderness: Tender to palpation in the paraspinal musculature on the right side. FABER: Tightness on the right. Sensory change: Gross sensation intact to all lumbar and sacral dermatomes.  Reflexes: 2+ at both patellar tendons, 2+ at achilles tendons, Babinski's downgoing.  Strength at foot  Plantar-flexion: 5/5 Dorsi-flexion: 5/5 Eversion: 5/5 Inversion: 5/5  Leg strength  Quad: 5/5 Hamstring: 5/5 Hip flexor: 5/5 Hip abductors: 5/5  Gait unremarkable.  Osteopathic findings  T7 extended rotated and side bent left L2 flexed rotated and side bent right Sacrum right on right     Impression and Recommendations:     This case required medical decision making of moderate complexity. The above documentation has been reviewed and is accurate and complete Judi Saa, DO       Note: This dictation was prepared with Dragon dictation along with smaller phrase technology. Any transcriptional errors that result from this process are unintentional.

## 2018-02-17 ENCOUNTER — Ambulatory Visit: Payer: BLUE CROSS/BLUE SHIELD | Admitting: Family Medicine

## 2018-02-17 ENCOUNTER — Encounter: Payer: Self-pay | Admitting: Family Medicine

## 2018-02-17 VITALS — BP 110/78 | HR 80 | Ht 70.0 in | Wt 218.0 lb

## 2018-02-17 DIAGNOSIS — M545 Low back pain: Secondary | ICD-10-CM | POA: Diagnosis not present

## 2018-02-17 DIAGNOSIS — M999 Biomechanical lesion, unspecified: Secondary | ICD-10-CM | POA: Diagnosis not present

## 2018-02-17 DIAGNOSIS — M79604 Pain in right leg: Secondary | ICD-10-CM

## 2018-02-17 NOTE — Assessment & Plan Note (Signed)
Decision today to treat with OMT was based on Physical Exam  After verbal consent patient was treated with HVLA, ME, FPR techniques in , thoracic, lumbar and sacral areas  Patient tolerated the procedure well with improvement in symptoms  Patient given exercises, stretches and lifestyle modifications  See medications in patient instructions if given  Patient will follow up in 4-8 weeks 

## 2018-02-17 NOTE — Patient Instructions (Signed)
You are doing great  Keep it up  Exercises 3 times a week.  [employer] "total support" online would be great  As long as you do well see me again in 2-3 months!

## 2018-02-17 NOTE — Assessment & Plan Note (Signed)
Patient no longer is having any radicular symptoms.  Still has a mild dull throbbing aching sensation of the lower back.  We discussed that this is mostly multifactorial with muscle imbalances and poor core strength.  Patient given a refresher of the exercises and encouraged him to do them on a more regular basis.  Discussed the vitamin supplementations.  Discussed icing regimen.  Still responding well to manipulation.  Follow-up again 2 to 3 months

## 2018-03-03 ENCOUNTER — Other Ambulatory Visit: Payer: Self-pay | Admitting: Family Medicine

## 2018-04-04 ENCOUNTER — Encounter: Payer: Self-pay | Admitting: Family Medicine

## 2018-04-04 ENCOUNTER — Ambulatory Visit: Payer: BLUE CROSS/BLUE SHIELD | Admitting: Family Medicine

## 2018-04-04 VITALS — BP 100/62 | HR 82 | Temp 97.8°F | Resp 18 | Wt 222.0 lb

## 2018-04-04 DIAGNOSIS — E785 Hyperlipidemia, unspecified: Secondary | ICD-10-CM | POA: Diagnosis not present

## 2018-04-04 DIAGNOSIS — R1310 Dysphagia, unspecified: Secondary | ICD-10-CM | POA: Diagnosis not present

## 2018-04-04 DIAGNOSIS — Z0000 Encounter for general adult medical examination without abnormal findings: Secondary | ICD-10-CM | POA: Diagnosis not present

## 2018-04-04 DIAGNOSIS — R739 Hyperglycemia, unspecified: Secondary | ICD-10-CM | POA: Diagnosis not present

## 2018-04-04 MED ORDER — METHOCARBAMOL 500 MG PO TABS: ORAL_TABLET | ORAL | 5 refills | Status: DC

## 2018-04-04 NOTE — Assessment & Plan Note (Signed)
Encouraged heart healthy diet, increase exercise, avoid trans fats, consider a krill oil cap daily 

## 2018-04-04 NOTE — Progress Notes (Signed)
Subjective:    Patient ID: Steven Goodwin, male    DOB: August 29, 1955, 63 y.o.   MRN: [id number]  No chief complaint on file.   HPI Patient is in today for follow-up on chronic medical concerns including hyperlipidemia, hyperglycemia and more.  No recent febrile illness or hospitalizations.  He is noting that sometimes with his evening pills they will get caught especially if he tries to lie down.  Does not have the same trouble with his pills in the morning but does acknowledge that sometimes when eating especially meat can get stuck at times.  He also notes his mother had a similar problem.  No significant dyspepsia.  No bloody or tarry stool.  No nausea or vomiting. Denies CP/palp/SOB/HA/congestion/fevers/GI or GU c/o. Taking meds as prescribed  Past Medical History:  Diagnosis Date  . Exercise-induced asthma 11/01/2017  . History of chicken pox   . History of shingles   . Hyperglycemia   . Hyperlipidemia 11/01/2017    Past Surgical History:  Procedure Laterality Date  . TONSILLECTOMY     childhood    Family History  Problem Relation Age of Onset  . Heart failure Mother   . Heart failure Father   . Mental illness Sister     Social History   Socioeconomic History  . Marital status: Married    Spouse name: Not on file  . Number of children: Not on file  . Years of education: Not on file  . Highest education level: Not on file  Occupational History  . Not on file  Social Needs  . Financial resource strain: Not on file  . Food insecurity:    Worry: Not on file    Inability: Not on file  . Transportation needs:    Medical: Not on file    Non-medical: Not on file  Tobacco Use  . Smoking status: Never Smoker  . Smokeless tobacco: Never Used  Substance and Sexual Activity  . Alcohol use: Not Currently    Frequency: Never  . Drug use: Never  . Sexual activity: Yes  Lifestyle  . Physical activity:    Days per week: Not on file    Minutes per session: Not on file  .  Stress: Not on file  Relationships  . Social connections:    Talks on phone: Not on file    Gets together: Not on file    Attends religious service: Not on file    Active member of club or organization: Not on file    Attends meetings of clubs or organizations: Not on file    Relationship status: Not on file  . Intimate partner violence:    Fear of current or ex partner: Not on file    Emotionally abused: Not on file    Physically abused: Not on file    Forced sexual activity: Not on file  Other Topics Concern  . Not on file  Social History Narrative  . Not on file    Outpatient Medications Prior to Visit  Medication Sig Dispense Refill  . albuterol (PROVENTIL HFA;VENTOLIN HFA) 108 (90 Base) MCG/ACT inhaler PLEASE SEE ATTACHED FOR DETAILED DIRECTIONS 6.7 Inhaler 1  . gabapentin (NEURONTIN) 100 MG capsule TAKE 2 CAPSULES (200 MG TOTAL) BY MOUTH AT BEDTIME. 180 capsule 2  . methocarbamol (ROBAXIN) 500 MG tablet TAKE 1 TABLET BY MOUTH qhs AS NEEDED 30 tablet 0   No facility-administered medications prior to visit.     Not on File  Review of  Systems  Constitutional: Negative for fever and malaise/fatigue.  HENT: Negative for congestion.   Eyes: Negative for blurred vision.  Respiratory: Negative for shortness of breath.   Cardiovascular: Negative for chest pain, palpitations and leg swelling.  Gastrointestinal: Negative for abdominal pain, blood in stool and nausea.  Genitourinary: Negative for dysuria and frequency.  Musculoskeletal: Negative for falls.  Skin: Negative for rash.  Neurological: Negative for dizziness, loss of consciousness and headaches.  Endo/Heme/Allergies: Negative for environmental allergies.  Psychiatric/Behavioral: Negative for depression. The patient is not nervous/anxious.        Objective:    Physical Exam Vitals signs and nursing note reviewed.  Constitutional:      General: He is not in acute distress.    Appearance: He is well-developed.    HENT:     Head: Normocephalic and atraumatic.     Nose: Nose normal.  Eyes:     General:        Right eye: No discharge.        Left eye: No discharge.  Neck:     Musculoskeletal: Normal range of motion and neck supple.  Cardiovascular:     Rate and Rhythm: Normal rate and regular rhythm.     Heart sounds: No murmur.  Pulmonary:     Effort: Pulmonary effort is normal.     Breath sounds: Normal breath sounds.  Abdominal:     General: Bowel sounds are normal.     Palpations: Abdomen is soft.     Tenderness: There is no abdominal tenderness.  Skin:    General: Skin is warm and dry.  Neurological:     Mental Status: He is alert and oriented to person, place, and time.     BP 100/62 (BP Location: Left Arm, Patient Position: Sitting, Cuff Size: Normal)   Pulse 82   Temp 97.8 F (36.6 C) (Oral)   Resp 18   Wt 222 lb (100.7 kg)   SpO2 97%   BMI 31.85 kg/m  Wt Readings from Last 3 Encounters:  04/04/18 222 lb (100.7 kg)  02/17/18 218 lb (98.9 kg)  01/10/18 217 lb (98.4 kg)     Lab Results  Component Value Date   WBC 7.3 11/01/2017   HGB 15.4 11/01/2017   HCT 44.9 11/01/2017   PLT 275.0 11/01/2017   GLUCOSE 88 11/01/2017   CHOL 149 11/01/2017   TRIG 133.0 11/01/2017   HDL 40.40 11/01/2017   LDLCALC 82 11/01/2017   ALT 18 11/01/2017   AST 18 11/01/2017   NA 138 11/01/2017   K 4.2 11/01/2017   CL 103 11/01/2017   CREATININE 1.13 11/01/2017   BUN 16 11/01/2017   CO2 27 11/01/2017   TSH 3.54 11/01/2017   PSA 1.29 11/01/2017   HGBA1C 5.6 11/01/2017   MICROALBUR <0.7 11/01/2017    Lab Results  Component Value Date   TSH 3.54 11/01/2017   Lab Results  Component Value Date   WBC 7.3 11/01/2017   HGB 15.4 11/01/2017   HCT 44.9 11/01/2017   MCV 87.7 11/01/2017   PLT 275.0 11/01/2017   Lab Results  Component Value Date   NA 138 11/01/2017   K 4.2 11/01/2017   CO2 27 11/01/2017   GLUCOSE 88 11/01/2017   BUN 16 11/01/2017   CREATININE 1.13 11/01/2017    BILITOT 0.5 11/01/2017   ALKPHOS 62 11/01/2017   AST 18 11/01/2017   ALT 18 11/01/2017   PROT 6.9 11/01/2017   ALBUMIN 4.2 11/01/2017  CALCIUM 9.4 11/01/2017   GFR 69.66 11/01/2017   Lab Results  Component Value Date   CHOL 149 11/01/2017   Lab Results  Component Value Date   HDL 40.40 11/01/2017   Lab Results  Component Value Date   LDLCALC 82 11/01/2017   Lab Results  Component Value Date   TRIG 133.0 11/01/2017   Lab Results  Component Value Date   CHOLHDL 4 11/01/2017   Lab Results  Component Value Date   HGBA1C 5.6 11/01/2017       Assessment & Plan:   Problem List Items Addressed This Visit    Hyperglycemia    hgba1c acceptable, minimize simple carbs. Increase exercise as tolerated      Preventative health care    He agrees to Shingrix but we are out. He will call to see if it has arrived      Hyperlipidemia    Encouraged heart healthy diet, increase exercise, avoid trans fats, consider a krill oil cap daily      Dysphagia - Primary    pilss catch at night and meat occasionally. He notes his mother has struggled with same. Denies dyspepsia but agrees to referral to gastroenterology for evaluation and also due for next screening colonoscopy. He had his first one around 49 and per patient it was clear. It was done in [place] and we have requested it again. Referred.      Relevant Orders   Ambulatory referral to Gastroenterology      I am having Steven Goodwin maintain his albuterol, gabapentin, and methocarbamol.  Meds ordered this encounter  Medications  . methocarbamol (ROBAXIN) 500 MG tablet    Sig: TAKE 1 TABLET BY MOUTH qhs AS NEEDED    Dispense:  30 tablet    Refill:  5     Danise Edge, MD

## 2018-04-04 NOTE — Assessment & Plan Note (Signed)
hgba1c acceptable, minimize simple carbs. Increase exercise as tolerated.  

## 2018-04-04 NOTE — Assessment & Plan Note (Signed)
pilss catch at night and meat occasionally. He notes his mother has struggled with same. Denies dyspepsia but agrees to referral to gastroenterology for evaluation and also due for next screening colonoscopy. He had his first one around 49 and per patient it was clear. It was done in [place] and we have requested it again. Referred.

## 2018-04-04 NOTE — Assessment & Plan Note (Signed)
He agrees to Shingrix but we are out. He will call to see if it has arrived

## 2018-04-04 NOTE — Patient Instructions (Addendum)
Call regarding Shingrix shot  Carbohydrate Counting for Diabetes Mellitus, Adult  Carbohydrate counting is a method of keeping track of how many carbohydrates you eat. Eating carbohydrates naturally increases the amount of sugar (glucose) in the blood. Counting how many carbohydrates you eat helps keep your blood glucose within normal limits, which helps you manage your diabetes (diabetes mellitus). It is important to know how many carbohydrates you can safely have in each meal. This is different for every person. A diet and nutrition specialist (registered dietitian) can help you make a meal plan and calculate how many carbohydrates you should have at each meal and snack. Carbohydrates are found in the following foods:  Grains, such as breads and cereals.  Dried beans and soy products.  Starchy vegetables, such as potatoes, peas, and corn.  Fruit and fruit juices.  Milk and yogurt.  Sweets and snack foods, such as cake, cookies, candy, chips, and soft drinks. How do I count carbohydrates? There are two ways to count carbohydrates in food. You can use either of the methods or a combination of both. Reading "Nutrition Facts" on packaged food The "Nutrition Facts" list is included on the labels of almost all packaged foods and beverages in the [place] It includes:  The serving size.  Information about nutrients in each serving, including the grams (g) of carbohydrate per serving. To use the "Nutrition Facts":  Decide how many servings you will have.  Multiply the number of servings by the number of carbohydrates per serving.  The resulting number is the total amount of carbohydrates that you will be having. Learning standard serving sizes of other foods When you eat carbohydrate foods that are not packaged or do not include "Nutrition Facts" on the label, you need to measure the servings in order to count the amount of carbohydrates:  Measure the foods that you will eat with a food  scale or measuring cup, if needed.  Decide how many standard-size servings you will eat.  Multiply the number of servings by 15. Most carbohydrate-rich foods have about 15 g of carbohydrates per serving. ? For example, if you eat 8 oz (170 g) of strawberries, you will have eaten 2 servings and 30 g of carbohydrates (2 servings x 15 g = 30 g).  For foods that have more than one food mixed, such as soups and casseroles, you must count the carbohydrates in each food that is included. The following list contains standard serving sizes of common carbohydrate-rich foods. Each of these servings has about 15 g of carbohydrates:   hamburger bun or  English muffin.   oz (15 mL) syrup.   oz (14 g) jelly.  1 slice of bread.  1 six-inch tortilla.  3 oz (85 g) cooked rice or pasta.  4 oz (113 g) cooked dried beans.  4 oz (113 g) starchy vegetable, such as peas, corn, or potatoes.  4 oz (113 g) hot cereal.  4 oz (113 g) mashed potatoes or  of a large baked potato.  4 oz (113 g) canned or frozen fruit.  4 oz (120 mL) fruit juice.  4-6 crackers.  6 chicken nuggets.  6 oz (170 g) unsweetened dry cereal.  6 oz (170 g) plain fat-free yogurt or yogurt sweetened with artificial sweeteners.  8 oz (240 mL) milk.  8 oz (170 g) fresh fruit or one small piece of fruit.  24 oz (680 g) popped popcorn. Example of carbohydrate counting Sample meal  3 oz (85 g) chicken breast.  6 oz (170 g) brown rice.  4 oz (113 g) corn.  8 oz (240 mL) milk.  8 oz (170 g) strawberries with sugar-free whipped topping. Carbohydrate calculation 1. Identify the foods that contain carbohydrates: ? Rice. ? Corn. ? Milk. ? Strawberries. 2. Calculate how many servings you have of each food: ? 2 servings rice. ? 1 serving corn. ? 1 serving milk. ? 1 serving strawberries. 3. Multiply each number of servings by 15 g: ? 2 servings rice x 15 g = 30 g. ? 1 serving corn x 15 g = 15 g. ? 1 serving milk  x 15 g = 15 g. ? 1 serving strawberries x 15 g = 15 g. 4. Add together all of the amounts to find the total grams of carbohydrates eaten: ? 30 g + 15 g + 15 g + 15 g = 75 g of carbohydrates total. Summary  Carbohydrate counting is a method of keeping track of how many carbohydrates you eat.  Eating carbohydrates naturally increases the amount of sugar (glucose) in the blood.  Counting how many carbohydrates you eat helps keep your blood glucose within normal limits, which helps you manage your diabetes.  A diet and nutrition specialist (registered dietitian) can help you make a meal plan and calculate how many carbohydrates you should have at each meal and snack. This information is not intended to replace advice given to you by your health care provider. Make sure you discuss any questions you have with your health care provider. Document Released: 01/19/2005 Document Revised: 07/29/2016 Document Reviewed: 07/03/2015 Elsevier Interactive Patient Education  2019 [employer].

## 2018-04-07 ENCOUNTER — Encounter: Payer: Self-pay | Admitting: Gastroenterology

## 2018-04-26 ENCOUNTER — Telehealth (INDEPENDENT_AMBULATORY_CARE_PROVIDER_SITE_OTHER): Payer: BLUE CROSS/BLUE SHIELD | Admitting: Gastroenterology

## 2018-04-26 ENCOUNTER — Other Ambulatory Visit: Payer: Self-pay

## 2018-04-26 DIAGNOSIS — R1310 Dysphagia, unspecified: Secondary | ICD-10-CM

## 2018-04-26 DIAGNOSIS — Z1211 Encounter for screening for malignant neoplasm of colon: Secondary | ICD-10-CM | POA: Diagnosis not present

## 2018-04-26 DIAGNOSIS — Z1212 Encounter for screening for malignant neoplasm of rectum: Secondary | ICD-10-CM | POA: Diagnosis not present

## 2018-04-26 DIAGNOSIS — R12 Heartburn: Secondary | ICD-10-CM | POA: Diagnosis not present

## 2018-04-26 NOTE — Patient Instructions (Signed)
We will contact you at a later date to schedule your EGD and Colonoscopy that will be done at our [facility].  It was a pleasure to see you today!  Vito Cirigliano, D.O.

## 2018-04-26 NOTE — Progress Notes (Signed)
Chief Complaint: Dysphagia, CRC screening   Referring Provider:     Bradd Canary, MD    HPI:    Due to current restrictions/limitations of in office visits due to COVID-19, this scheduled clinical appointment was converted to a telehealth consultation via telephone.  -Time of medical discussion: 22 minutes -Patient consented to the consult via telephone -Names of all parties present: Steven Goodwin (patient), Doristine Locks, DO, [facility] (physician)  Steven Goodwin is a 63 y.o. male referred to the [facility]astroenterology Clinic for evaluation of solid food dysphagia.  He states he has had intermittent dysphagia with pills and some foods (meat) over the last year or so. Sxs clear with time; does not flush with foods. Stuck in mid chest area. Worse when taking Abx in 2019. Occasional odynophagia with large pills. No ER evaluations for this and no hx of food impactions. Son and mother with similar sxs, both with EGD with dilation in the past. No previous EGD. Otherwise, no fever, chills, night sweats or overt GIB.   Occasional HB/regurgitation with spicy foods or eating too close to bedtime. Does not take any antacids.   Additionally, he is due for routine, age appropriate ongoing CRC screening.  Last colonoscopy was completed around age 74 and due to and normal per patient.  Past medical history, past surgical history, social history, family history, medications, and allergies reviewed in the chart and with patient over the phone.  Past Medical History:  Diagnosis Date  . Exercise-induced asthma 11/01/2017  . History of chicken pox   . History of shingles   . Hyperglycemia   . Hyperlipidemia 11/01/2017     Past Surgical History:  Procedure Laterality Date  . TONSILLECTOMY     childhood   Family History  Problem Relation Age of Onset  . Heart failure Mother   . Heart failure Father   . Mental illness Sister    Social History   Tobacco Use  . Smoking status: Never  Smoker  . Smokeless tobacco: Never Used  Substance Use Topics  . Alcohol use: Not Currently    Frequency: Never  . Drug use: Never   Current Outpatient Medications  Medication Sig Dispense Refill  . albuterol (PROVENTIL HFA;VENTOLIN HFA) 108 (90 Base) MCG/ACT inhaler PLEASE SEE ATTACHED FOR DETAILED DIRECTIONS 6.7 Inhaler 1  . gabapentin (NEURONTIN) 100 MG capsule TAKE 2 CAPSULES (200 MG TOTAL) BY MOUTH AT BEDTIME. 180 capsule 2  . methocarbamol (ROBAXIN) 500 MG tablet TAKE 1 TABLET BY MOUTH qhs AS NEEDED 30 tablet 5   No current facility-administered medications for this visit.    Not on File   Review of Systems: All systems reviewed and negative except where noted in HPI.     Physical Exam:    Physical exam not completed due to the nature of this telehealth communication.  Patient was otherwise alert and oriented and well communicative.   ASSESSMENT AND PLAN;   [name] is a 63 y.o. male presenting with:  1) Dysphagia: Discussed the DDX solid food/pill dysphagia with the patient at length and will proceed as below: -EGD with dilation when endoscopy limitations are lifted 2/2 current COVID-19 pandemic - In the meantime, continue to cut food into small pieces, chew thoroughly, drink plenty of fluids with meals -If EGD unrevealing, plan for Esophageal Manometry  2) CRC screening: Due for age-appropriate ongoing CRC screening.  Otherwise without any lower GI symptoms and no family  history of colon cancer. -Schedule for colonoscopy when  endoscopy limitations are lifted 2/2 current COVID-19 pandemic - We will try to obtain previous colonoscopy report from GI in [place]  3) Heartburn: Heartburn/regurgitation which is generally well controlled with dietary and lifestyle modifications.  Does not take any antacids - Resume current antireflux lifestyle measures -Will evaluate for objective evidence of reflux at time of EGD as above  The indications, risks, and benefits of EGD  and colonoscopy were explained to the patient in detail. Risks include but are not limited to bleeding, perforation, adverse reaction to medications, and cardiopulmonary compromise. Sequelae include but are not limited to the possibility of surgery, hositalization, and mortality. The patient verbalized understanding and wished to proceed. All questions answered. Will schedule when able due to current restrictions related to the COVID-19 pandemic.  Further recommendations pending results of the exam.    Steven Cleverly, DO, FACG  04/26/2018, 10:31 AM   Bradd Canary, MD

## 2018-05-10 ENCOUNTER — Telehealth: Payer: Self-pay

## 2018-05-10 NOTE — Telephone Encounter (Signed)
Called patient to reschedule appointment on 05/19/2018 

## 2018-05-16 ENCOUNTER — Telehealth: Payer: Self-pay

## 2018-05-16 NOTE — Telephone Encounter (Signed)
Copied from CRM [id number]. Topic: Quick Communication - Appointment Cancellation >> May 16, 2018  1:02 PM Darletta Moll L wrote: Patient called to cancel appointment scheduled for 05/19/2018. Patient {HAS rescheduled their appointment.  Route to department's PEC pool.

## 2018-05-17 NOTE — Telephone Encounter (Signed)
Noted  

## 2018-05-19 ENCOUNTER — Ambulatory Visit: Payer: BLUE CROSS/BLUE SHIELD | Admitting: Family Medicine

## 2018-06-20 ENCOUNTER — Telehealth: Payer: Self-pay | Admitting: Gastroenterology

## 2018-06-20 NOTE — Telephone Encounter (Signed)
Received patient's colonoscopy reports from [place] per OV note on 04/26/2018 Dr.Cirigliano recommended patient have and ECL when COVID-19 restrictions were lifted. Spoke with patient states would like to schedule procedure in the Fall August or September will call patient back when this schedule is available.

## 2018-09-20 NOTE — Telephone Encounter (Signed)
Called and spoke to patient. He wants to wait until Fall to schedule his endo/colon. Records placed in the records review folder in [facility]

## 2018-10-06 ENCOUNTER — Ambulatory Visit: Payer: BLUE CROSS/BLUE SHIELD | Admitting: Family Medicine

## 2018-10-07 ENCOUNTER — Other Ambulatory Visit: Payer: Self-pay

## 2018-10-07 ENCOUNTER — Ambulatory Visit (INDEPENDENT_AMBULATORY_CARE_PROVIDER_SITE_OTHER): Payer: BC Managed Care – PPO | Admitting: Family Medicine

## 2018-10-07 DIAGNOSIS — M2550 Pain in unspecified joint: Secondary | ICD-10-CM

## 2018-10-07 DIAGNOSIS — R739 Hyperglycemia, unspecified: Secondary | ICD-10-CM | POA: Diagnosis not present

## 2018-10-07 DIAGNOSIS — R1310 Dysphagia, unspecified: Secondary | ICD-10-CM

## 2018-10-07 DIAGNOSIS — E785 Hyperlipidemia, unspecified: Secondary | ICD-10-CM

## 2018-10-07 DIAGNOSIS — R351 Nocturia: Secondary | ICD-10-CM

## 2018-10-07 DIAGNOSIS — M1990 Unspecified osteoarthritis, unspecified site: Secondary | ICD-10-CM

## 2018-10-09 NOTE — Assessment & Plan Note (Signed)
hgba1c acceptable, minimize simple carbs. Increase exercise as tolerated.  

## 2018-10-09 NOTE — Progress Notes (Signed)
Virtual Visit via Video Note  I connected with Steven Goodwin on 10/07/18 at  8:40 AM EDT by a video enabled telemedicine application and verified that I am speaking with the correct person using two identifiers.  Location: Patient: home Provider: home   I discussed the limitations of evaluation and management by telemedicine and the availability of in person appointments. The patient expressed understanding and agreed to proceed. Crissie Sickles, CMA was able to get patient set up on visit, video   Subjective:    Patient ID: Steven Goodwin, male    DOB: 06-13-1955, 63 y.o.   MRN: [id number]  No chief complaint on file.   HPI Patient is in today for follow up on dysphagia, hyperglycemia and more. No recent febrile illness or hospitalizations. He is noting increased back and joint pain especially in his great toes and back. No swelling or redness or warmth. He has changed his diet and is noting an improvement in his dysphagia but not complete resolution. He has put off endoscopy due to pandemic. Is taking Gabapentin, Tart Cherry, Turmeric and he feels that may be helping some. Denies CP/palp/SOB/HA/congestion/fevers/GI or GU c/o. Taking meds as prescribed  Past Medical History:  Diagnosis Date  . Exercise-induced asthma 11/01/2017  . History of chicken pox   . History of shingles   . Hyperglycemia   . Hyperlipidemia 11/01/2017    Past Surgical History:  Procedure Laterality Date  . TONSILLECTOMY     childhood    Family History  Problem Relation Age of Onset  . Heart failure Mother   . Heart failure Father   . Mental illness Sister     Social History   Socioeconomic History  . Marital status: Married    Spouse name: Not on file  . Number of children: Not on file  . Years of education: Not on file  . Highest education level: Not on file  Occupational History  . Not on file  Social Needs  . Financial resource strain: Not on file  . Food insecurity    Worry: Not on file   Inability: Not on file  . Transportation needs    Medical: Not on file    Non-medical: Not on file  Tobacco Use  . Smoking status: Never Smoker  . Smokeless tobacco: Never Used  Substance and Sexual Activity  . Alcohol use: Not Currently    Frequency: Never  . Drug use: Never  . Sexual activity: Yes  Lifestyle  . Physical activity    Days per week: Not on file    Minutes per session: Not on file  . Stress: Not on file  Relationships  . Social Musician on phone: Not on file    Gets together: Not on file    Attends religious service: Not on file    Active member of club or organization: Not on file    Attends meetings of clubs or organizations: Not on file    Relationship status: Not on file  . Intimate partner violence    Fear of current or ex partner: Not on file    Emotionally abused: Not on file    Physically abused: Not on file    Forced sexual activity: Not on file  Other Topics Concern  . Not on file  Social History Narrative  . Not on file    Outpatient Medications Prior to Visit  Medication Sig Dispense Refill  . albuterol (PROVENTIL HFA;VENTOLIN HFA) 108 (90 Base) MCG/ACT inhaler  PLEASE SEE ATTACHED FOR DETAILED DIRECTIONS 6.7 Inhaler 1  . gabapentin (NEURONTIN) 100 MG capsule TAKE 2 CAPSULES (200 MG TOTAL) BY MOUTH AT BEDTIME. 180 capsule 2  . methocarbamol (ROBAXIN) 500 MG tablet TAKE 1 TABLET BY MOUTH qhs AS NEEDED 30 tablet 5   No facility-administered medications prior to visit.     Not on File  ROS     Objective:    Physical Exam  There were no vitals taken for this visit. Wt Readings from Last 3 Encounters:  04/04/18 222 lb (100.7 kg)  02/17/18 218 lb (98.9 kg)  01/10/18 217 lb (98.4 kg)    Diabetic Foot Exam - Simple   No data filed     Lab Results  Component Value Date   WBC 7.3 11/01/2017   HGB 15.4 11/01/2017   HCT 44.9 11/01/2017   PLT 275.0 11/01/2017   GLUCOSE 88 11/01/2017   CHOL 149 11/01/2017   TRIG 133.0  11/01/2017   HDL 40.40 11/01/2017   LDLCALC 82 11/01/2017   ALT 18 11/01/2017   AST 18 11/01/2017   NA 138 11/01/2017   K 4.2 11/01/2017   CL 103 11/01/2017   CREATININE 1.13 11/01/2017   BUN 16 11/01/2017   CO2 27 11/01/2017   TSH 3.54 11/01/2017   PSA 1.29 11/01/2017   HGBA1C 5.6 11/01/2017   MICROALBUR <0.7 11/01/2017    Lab Results  Component Value Date   TSH 3.54 11/01/2017   Lab Results  Component Value Date   WBC 7.3 11/01/2017   HGB 15.4 11/01/2017   HCT 44.9 11/01/2017   MCV 87.7 11/01/2017   PLT 275.0 11/01/2017   Lab Results  Component Value Date   NA 138 11/01/2017   K 4.2 11/01/2017   CO2 27 11/01/2017   GLUCOSE 88 11/01/2017   BUN 16 11/01/2017   CREATININE 1.13 11/01/2017   BILITOT 0.5 11/01/2017   ALKPHOS 62 11/01/2017   AST 18 11/01/2017   ALT 18 11/01/2017   PROT 6.9 11/01/2017   ALBUMIN 4.2 11/01/2017   CALCIUM 9.4 11/01/2017   GFR 69.66 11/01/2017   Lab Results  Component Value Date   CHOL 149 11/01/2017   Lab Results  Component Value Date   HDL 40.40 11/01/2017   Lab Results  Component Value Date   LDLCALC 82 11/01/2017   Lab Results  Component Value Date   TRIG 133.0 11/01/2017   Lab Results  Component Value Date   CHOLHDL 4 11/01/2017   Lab Results  Component Value Date   HGBA1C 5.6 11/01/2017       Assessment & Plan:   Problem List Items Addressed This Visit    Hyperglycemia - Primary    hgba1c acceptable, minimize simple carbs. Increase exercise as tolerated.       Relevant Orders   Hemoglobin A1c   CBC   Comprehensive metabolic panel   TSH   Osteoarthritis    Has noted increased joint pain with pain in many joints most notably in feet in big toes and in back. No fall or injury. No warmth or redness. He has noted stiffness with prolonged immobility. Encouraged to stay active. Encouraged moist heat and gentle stretching as tolerated. May try NSAIDs and prescription meds as directed and report if symptoms  worsen or seek immediate care. Check RF      Hyperlipidemia    Encouraged heart healthy diet, increase exercise, avoid trans fats, consider a krill oil cap daily      Relevant  Orders   Lipid panel   TSH   Dysphagia    Has improved since last visit to some degrees but still notes concerns. He has put off having his endoscopy with gastroenterology due to the pandemic. He is encouraged to proceed if he continues to have symptoms.       Other Visit Diagnoses    Nocturia       Relevant Orders   PSA   Arthralgia, unspecified joint       Relevant Orders   Rheumatoid factor   Uric acid      I am having Jahmal Dejoseph maintain his albuterol, gabapentin, and methocarbamol.  No orders of the defined types were placed in this encounter. s  I discussed the assessment and treatment plan with the patient. The patient was provided an opportunity to ask questions and all were answered. The patient agreed with the plan and demonstrated an understanding of the instructions.   The patient was advised to call back or seek an in-person evaluation if the symptoms worsen or if the condition fails to improve as anticipated.  I provided 25 minutes of non-face-to-face time during this encounter.   Danise EdgeStacey Falan Hensler, MD

## 2018-10-09 NOTE — Assessment & Plan Note (Signed)
Has improved since last visit to some degrees but still notes concerns. He has put off having his endoscopy with gastroenterology due to the pandemic. He is encouraged to proceed if he continues to have symptoms.

## 2018-10-09 NOTE — Assessment & Plan Note (Signed)
Encouraged heart healthy diet, increase exercise, avoid trans fats, consider a krill oil cap daily 

## 2018-10-09 NOTE — Assessment & Plan Note (Signed)
Has noted increased joint pain with pain in many joints most notably in feet in big toes and in back. No fall or injury. No warmth or redness. He has noted stiffness with prolonged immobility. Encouraged to stay active. Encouraged moist heat and gentle stretching as tolerated. May try NSAIDs and prescription meds as directed and report if symptoms worsen or seek immediate care. Check RF

## 2018-11-07 ENCOUNTER — Ambulatory Visit: Payer: BLUE CROSS/BLUE SHIELD | Admitting: Family Medicine

## 2018-12-12 ENCOUNTER — Other Ambulatory Visit: Payer: Self-pay | Admitting: Family Medicine

## 2019-03-30 ENCOUNTER — Encounter: Payer: Self-pay | Admitting: Family Medicine

## 2019-03-30 ENCOUNTER — Ambulatory Visit: Payer: BC Managed Care – PPO | Admitting: Family Medicine

## 2019-03-30 ENCOUNTER — Other Ambulatory Visit: Payer: Self-pay

## 2019-03-30 VITALS — BP 122/76 | HR 88 | Ht 70.0 in | Wt 221.0 lb

## 2019-03-30 DIAGNOSIS — M999 Biomechanical lesion, unspecified: Secondary | ICD-10-CM | POA: Diagnosis not present

## 2019-03-30 DIAGNOSIS — M545 Low back pain, unspecified: Secondary | ICD-10-CM

## 2019-03-30 DIAGNOSIS — M79604 Pain in right leg: Secondary | ICD-10-CM

## 2019-03-30 NOTE — Patient Instructions (Signed)
Overall doing well Work on core See me when you need me

## 2019-03-30 NOTE — Assessment & Plan Note (Signed)
Decision today to treat with OMT was based on Physical Exam  After verbal consent patient was treated with HVLA, ME, FPR techniques in  thoracic, lumbar and sacral areas  Patient tolerated the procedure well with improvement in symptoms  Patient given exercises, stretches and lifestyle modifications  See medications in patient instructions if given  Patient will follow up in 4-8 weeks 

## 2019-03-30 NOTE — Assessment & Plan Note (Signed)
Patient was having some radicular symptoms previously but doing relatively well.  Uses the gabapentin intermittently.  This is a chronic condition with some mild exacerbation.  Patient responded well to osteopathic manipulation.  Patient can follow-up with me on an as-needed basis.

## 2019-03-30 NOTE — Progress Notes (Signed)
[name] [address] [place] [address] Phone: [phone] Subjective:   Steven Goodwin, am serving as a scribe for Dr. Antoine Primas. This visit occurred during the SARS-CoV-2 public health emergency.  Safety protocols were in place, including screening questions prior to the visit, additional usage of staff PPE, and extensive cleaning of exam room while observing appropriate contact time as indicated for disinfecting solutions.   I'm seeing this patient by the request  of:  Bradd Canary, MD  CC:  Low back exam recent pain [id number]  Steven Goodwin is a 64 y.o. male coming in with complaint of back pain. Last seen on 02/17/2018 for OMT. Patient states that he did have a flare up working out in the rain. Patient feels improvement.  History of radiculopathy.  He is seen.  Patient states that unfortunately is having some increasing it again.  Was doing a lot more work outside.  Has some mild weakness.  Restarted gabapentin and then discontinued.  Slowly getting better but feels like a manipulation will be beneficial.      Past Medical History:  Diagnosis Date  . Exercise-induced asthma 11/01/2017  . History of chicken pox   . History of shingles   . Hyperglycemia   . Hyperlipidemia 11/01/2017   Past Surgical History:  Procedure Laterality Date  . TONSILLECTOMY     childhood   Social History   Socioeconomic History  . Marital status: Married    Spouse name: Not on file  . Number of children: Not on file  . Years of education: Not on file  . Highest education level: Not on file  Occupational History  . Not on file  Tobacco Use  . Smoking status: Never Smoker  . Smokeless tobacco: Never Used  Substance and Sexual Activity  . Alcohol use: Not Currently  . Drug use: Never  . Sexual activity: Yes  Other Topics Concern  . Not on file  Social History Narrative  . Not on file   Social Determinants of Health   Financial Resource  Strain:   . Difficulty of Paying Living Expenses: Not on file  Food Insecurity:   . Worried About Programme researcher, broadcasting/film/video in the Last Year: Not on file  . Ran Out of Food in the Last Year: Not on file  Transportation Needs:   . Lack of Transportation (Medical): Not on file  . Lack of Transportation (Non-Medical): Not on file  Physical Activity:   . Days of Exercise per Week: Not on file  . Minutes of Exercise per Session: Not on file  Stress:   . Feeling of Stress : Not on file  Social Connections:   . Frequency of Communication with Friends and Family: Not on file  . Frequency of Social Gatherings with Friends and Family: Not on file  . Attends Religious Services: Not on file  . Active Member of Clubs or Organizations: Not on file  . Attends Banker Meetings: Not on file  . Marital Status: Not on file   Not on File Family History  Problem Relation Age of Onset  . Heart failure Mother   . Heart failure Father   . Mental illness Sister       Current Outpatient Medications (Respiratory):  .  albuterol (PROVENTIL HFA;VENTOLIN HFA) 108 (90 Base) MCG/ACT inhaler, PLEASE SEE ATTACHED FOR DETAILED DIRECTIONS    Current Outpatient Medications (Other):  .  gabapentin (NEURONTIN) 100 MG capsule, TAKE 2  CAPSULES (200 MG TOTAL) BY MOUTH AT BEDTIME. .  methocarbamol (ROBAXIN) 500 MG tablet, TAKE 1 TABLET BY MOUTH qhs AS NEEDED   Reviewed prior external information including notes and imaging from  primary care provider As well as notes that were available from care everywhere and other healthcare systems.  Past medical history, social, surgical and family history all reviewed in electronic medical record.  No pertanent information unless stated regarding to the chief complaint.   Review of Systems:  No headache, visual changes, nausea, vomiting, diarrhea, constipation, dizziness, abdominal pain, skin rash, fevers, chills, night sweats, weight loss, swollen lymph nodes,  body aches, joint swelling, chest pain, shortness of breath, mood changes. POSITIVE muscle aches  Objective  Blood pressure 122/76, pulse 88, height 5\' 10"  (1.778 m), weight 221 lb (100.2 kg), SpO2 98 %.   General: No apparent distress alert and oriented x3 mood and affect normal, dressed appropriately.  HEENT: Pupils equal, extraocular movements intact  Respiratory: Patient's speak in full sentences and does not appear short of breath  Cardiovascular: No lower extremity edema, non tender, no erythema  Skin: Warm dry intact with no signs of infection or rash on extremities or on axial skeleton.  Abdomen: Soft nontender  Neuro: Cranial nerves II through XII are intact, neurovascularly intact in all extremities with 2+ DTRs and 2+ pulses.  Lymph: No lymphadenopathy of posterior or anterior cervical chain or axillae bilaterally.  Gait normal with good balance and coordination.  MSK:  Non tender with full range of motion and good stability and symmetric strength and tone of shoulders, elbows, wrist, hip, knee and ankles bilaterally.  Low back exam has some mild loss of lordosis, tightness with straight leg test but no radicular symptoms.  +5 strength with 2+ deep tendon reflexes.  Limited extension lacking last 10 degrees, near full flexion.  Mild 5 degrees decrease in sidebending to the right  Osteopathic findings T6 extended rotated and side bent left L3 flexed rotated and side bent right Sacrum right on right    Impression and Recommendations:     This case required medical decision making of moderate complexity. The above documentation has been reviewed and is accurate and complete Lyndal Pulley, DO       Note: This dictation was prepared with Dragon dictation along with smaller phrase technology. Any transcriptional errors that result from this process are unintentional.

## 2019-04-23 ENCOUNTER — Other Ambulatory Visit: Payer: Self-pay | Admitting: Family Medicine

## 2019-04-23 NOTE — Telephone Encounter (Signed)
Do you want to continue this?  He takes for osteoarthritis.

## 2019-09-06 ENCOUNTER — Other Ambulatory Visit: Payer: Self-pay | Admitting: Family Medicine

## 2020-03-04 ENCOUNTER — Other Ambulatory Visit: Payer: Self-pay | Admitting: Family Medicine

## 2020-05-30 ENCOUNTER — Other Ambulatory Visit: Payer: Self-pay | Admitting: Family Medicine

## 2020-05-30 NOTE — Telephone Encounter (Signed)
Patient needs appointment. Sent patient MyChart message to schedule.

## 2020-07-04 ENCOUNTER — Other Ambulatory Visit: Payer: Self-pay

## 2020-07-04 ENCOUNTER — Encounter: Payer: Self-pay | Admitting: Family Medicine

## 2020-07-04 ENCOUNTER — Ambulatory Visit: Payer: BC Managed Care – PPO | Admitting: Family Medicine

## 2020-07-04 DIAGNOSIS — M79604 Pain in right leg: Secondary | ICD-10-CM | POA: Diagnosis not present

## 2020-07-04 DIAGNOSIS — M545 Low back pain, unspecified: Secondary | ICD-10-CM

## 2020-07-04 MED ORDER — GABAPENTIN 100 MG PO CAPS: 200.0000 mg | ORAL_CAPSULE | Freq: Every day | ORAL | 3 refills | Status: DC

## 2020-07-04 NOTE — Assessment & Plan Note (Signed)
Patient has been doing very well overall.  We discussed with patient having radiating pain distally time not to make any significant changes.  Continue with the home exercises.  Continue with the gabapentin but can potentially try to titrate down to 100 mg if he would like.  Patient will consider acetaminophen as updated if he decides to do so.  Patient can follow-up with me as needed

## 2020-07-04 NOTE — Progress Notes (Signed)
[name] [address] [place] [address] Phone: [phone] Subjective:   [name], am serving as a scribe for Dr. Antoine Primas. This visit occurred during the SARS-CoV-2 public health emergency.  Safety protocols were in place, including screening questions prior to the visit, additional usage of staff PPE, and extensive cleaning of exam room while observing appropriate contact time as indicated for disinfecting solutions.   I'm seeing this patient by the request  of:  Bradd Canary, MD  CC: Low back pain  [id number]  Steven Goodwin is a 65 y.o. male coming in with complaint of low back pain.  Patient was last seen in February 2021.  Patient has continued to take gabapentin.  Patient states that he has been doing well. Using gabapentin on daily basis and wants to know how he should continue with medication. Is able to move heavy objects and feels back is doing well.   Did go on a hike this past weekend and had R leg cramp that night which is a symptom that he has had previously but not occurring that frequently.    Previous x-rays are from 2019 of the lumbar spine.  These were independently visualized by me today.  X-rays show mild degenerative disc disease mostly at L2-L3  Past Medical History:  Diagnosis Date  . Exercise-induced asthma 11/01/2017  . History of chicken pox   . History of shingles   . Hyperglycemia   . Hyperlipidemia 11/01/2017   Past Surgical History:  Procedure Laterality Date  . TONSILLECTOMY     childhood   Social History   Socioeconomic History  . Marital status: Married    Spouse name: Not on file  . Number of children: Not on file  . Years of education: Not on file  . Highest education level: Not on file  Occupational History  . Not on file  Tobacco Use  . Smoking status: Never Smoker  . Smokeless tobacco: Never Used  Substance and Sexual Activity  . Alcohol use: Not Currently  . Drug use: Never   . Sexual activity: Yes  Other Topics Concern  . Not on file  Social History Narrative  . Not on file   Social Determinants of Health   Financial Resource Strain: Not on file  Food Insecurity: Not on file  Transportation Needs: Not on file  Physical Activity: Not on file  Stress: Not on file  Social Connections: Not on file   Not on File Family History  Problem Relation Age of Onset  . Heart failure Mother   . Heart failure Father   . Mental illness Sister       Current Outpatient Medications (Respiratory):  .  albuterol (PROVENTIL HFA;VENTOLIN HFA) 108 (90 Base) MCG/ACT inhaler, PLEASE SEE ATTACHED FOR DETAILED DIRECTIONS    Current Outpatient Medications (Other):  .  gabapentin (NEURONTIN) 100 MG capsule, TAKE 2 CAPSULES (200 MG TOTAL) BY MOUTH AT BEDTIME. Marland Kitchen  gabapentin (NEURONTIN) 100 MG capsule, Take 2 capsules (200 mg total) by mouth at bedtime. .  methocarbamol (ROBAXIN) 500 MG tablet, TAKE 1 TABLET BY MOUTH AT BEDTIME AS NEEDED   Reviewed prior external information including notes and imaging from  primary care provider As well as notes that were available from care everywhere and other healthcare systems.  Past medical history, social, surgical and family history all reviewed in electronic medical record.  No pertanent information unless stated regarding to the chief complaint.   Review of  Systems:  No headache, visual changes, nausea, vomiting, diarrhea, constipation, dizziness, abdominal pain, skin rash, fevers, chills, night sweats, weight loss, swollen lymph nodes, body aches, joint swelling, chest pain, shortness of breath, mood changes.   Objective  Blood pressure 110/82, pulse 84, height 5\' 10"  (1.778 m), weight 211 lb (95.7 kg), SpO2 95 %.   General: No apparent distress alert and oriented x3 mood and affect normal, dressed appropriately.  HEENT: Pupils equal, extraocular movements intact  Respiratory: Patient's speak in full sentences and does not  appear short of breath  Cardiovascular: No lower extremity edema, non tender, no erythema  Gait normal with good balance and coordination.  Back exam shows very mild loss of lordosis.  Patient has good strength of the lower extremities bilaterally.  Deep tendon reflexes intact.  Negative FABER test.  Negative straight leg test.  Patient feels that his right thigh feels different but does have good strength noted with range of motion.    Impression and Recommendations:    The above documentation has been reviewed and is accurate and complete , DO

## 2020-07-04 NOTE — Patient Instructions (Signed)
Good to see you Take 1-3 gabapentin at night Stay active See me again when you need me

## 2020-08-14 ENCOUNTER — Other Ambulatory Visit: Payer: Self-pay | Admitting: Family Medicine

## 2020-11-11 ENCOUNTER — Other Ambulatory Visit: Payer: Self-pay

## 2020-11-12 ENCOUNTER — Ambulatory Visit: Payer: BC Managed Care – PPO | Attending: Internal Medicine

## 2020-11-12 ENCOUNTER — Other Ambulatory Visit (HOSPITAL_BASED_OUTPATIENT_CLINIC_OR_DEPARTMENT_OTHER): Payer: Self-pay

## 2020-11-12 ENCOUNTER — Ambulatory Visit (INDEPENDENT_AMBULATORY_CARE_PROVIDER_SITE_OTHER): Payer: BC Managed Care – PPO | Admitting: Medical

## 2020-11-12 VITALS — BP 110/73 | HR 58 | Resp 18 | Ht 70.0 in | Wt 207.0 lb

## 2020-11-12 DIAGNOSIS — Z23 Encounter for immunization: Secondary | ICD-10-CM

## 2020-11-12 DIAGNOSIS — Z1211 Encounter for screening for malignant neoplasm of colon: Secondary | ICD-10-CM | POA: Diagnosis not present

## 2020-11-12 DIAGNOSIS — Z125 Encounter for screening for malignant neoplasm of prostate: Secondary | ICD-10-CM

## 2020-11-12 DIAGNOSIS — R739 Hyperglycemia, unspecified: Secondary | ICD-10-CM | POA: Diagnosis not present

## 2020-11-12 DIAGNOSIS — R0609 Other forms of dyspnea: Secondary | ICD-10-CM | POA: Diagnosis not present

## 2020-11-12 DIAGNOSIS — Z0000 Encounter for general adult medical examination without abnormal findings: Secondary | ICD-10-CM | POA: Diagnosis not present

## 2020-11-12 DIAGNOSIS — Z1283 Encounter for screening for malignant neoplasm of skin: Secondary | ICD-10-CM

## 2020-11-12 DIAGNOSIS — Z8249 Family history of ischemic heart disease and other diseases of the circulatory system: Secondary | ICD-10-CM

## 2020-11-12 LAB — COMPREHENSIVE METABOLIC PANEL
ALT: 16 U/L (ref 0–53)
AST: 18 U/L (ref 0–37)
Albumin: 4.1 g/dL (ref 3.5–5.2)
Alkaline Phosphatase: 68 U/L (ref 39–117)
BUN: 10 mg/dL (ref 6–23)
CO2: 29 mEq/L (ref 19–32)
Calcium: 9.3 mg/dL (ref 8.4–10.5)
Chloride: 103 mEq/L (ref 96–112)
Creatinine, Ser: 1.04 mg/dL (ref 0.40–1.50)
GFR: 75.07 mL/min (ref >60.00)
Glucose, Bld: 89 mg/dL (ref 70–99)
Potassium: 4.4 mEq/L (ref 3.5–5.1)
Sodium: 139 mEq/L (ref 135–145)
Total Bilirubin: 0.6 mg/dL (ref 0.2–1.2)
Total Protein: 6.8 g/dL (ref 6.0–8.3)

## 2020-11-12 LAB — CBC WITH DIFFERENTIAL/PLATELET
Basophils Absolute: 0.1 10*3/uL (ref 0.0–0.1)
Basophils Relative: 1.5 % (ref 0.0–3.0)
Eosinophils Absolute: 0.3 10*3/uL (ref 0.0–0.7)
Eosinophils Relative: 5.6 % — ABNORMAL HIGH (ref 0.0–5.0)
HCT: 45.4 % (ref 39.0–52.0)
Hemoglobin: 15.4 g/dL (ref 13.0–17.0)
Lymphocytes Relative: 43.5 % (ref 12.0–46.0)
Lymphs Abs: 2 10*3/uL (ref 0.7–4.0)
MCHC: 33.9 g/dL (ref 30.0–36.0)
MCV: 88.3 fl (ref 78.0–100.0)
Monocytes Absolute: 0.5 10*3/uL (ref 0.1–1.0)
Monocytes Relative: 11.5 % (ref 3.0–12.0)
Neutro Abs: 1.8 10*3/uL (ref 1.4–7.7)
Neutrophils Relative %: 37.9 % — ABNORMAL LOW (ref 43.0–77.0)
Platelets: 234 10*3/uL (ref 150.0–400.0)
RBC: 5.14 Mil/uL (ref 4.22–5.81)
RDW: 13.5 % (ref 11.5–15.5)
WBC: 4.6 10*3/uL (ref 4.0–10.5)

## 2020-11-12 LAB — LIPID PANEL
Cholesterol: 154 mg/dL (ref 0–200)
HDL: 39 mg/dL — ABNORMAL LOW (ref >39.00)
LDL Cholesterol: 95 mg/dL (ref 0–99)
NonHDL: 114.61
Total CHOL/HDL Ratio: 4
Triglycerides: 96 mg/dL (ref 0.0–149.0)
VLDL: 19.2 mg/dL (ref 0.0–40.0)

## 2020-11-12 LAB — HEMOGLOBIN A1C: Hgb A1c MFr Bld: 5.7 % (ref 4.6–6.5)

## 2020-11-12 LAB — PSA: PSA: 1.24 ng/mL (ref 0.10–4.00)

## 2020-11-12 MED ORDER — INFLUENZA VAC A&B SA ADJ QUAD 0.5 ML IM PRSY
PREFILLED_SYRINGE | INTRAMUSCULAR | 0 refills | Status: AC
  Filled 2020-11-12: qty 0.5, 1d supply, fill #0

## 2020-11-12 NOTE — Progress Notes (Signed)
   Cov[facility] Vaccination [facility]  Name:  Jordan Pardini    MRN: [id number] DOB: December 17, 1955  11/12/2020  Mr. Deshmukh was observed post Covid-19 immunization for 15 minutes without incident. He was provided with Vaccine Information Sheet and instruction to access the V-Safe system.   Mr. Brueckner was instructed to call 911 with any severe reactions post vaccine: Difficulty breathing  Swelling of face and throat  A fast heartbeat  A bad rash all over body  Dizziness and weakness

## 2020-11-12 NOTE — Patient Instructions (Addendum)
For you wellness exam today I have ordered cbc, cmp, lipid panel and psa.  Vaccine given today flu vaccine.  Recommend exercise and healthy diet.  We will let you know lab results as they come in.  Referral placed for colonoscopy.  Placed referral to derm for skin cancer screening and to evaluate probable seborrheic keratosis lesion on forearm.  Follow up date appointment will be determined after lab review.    For family history of CAD/bypass and dyspnea on exertion plan to refer you cardilogist after review of cholesterol level and 3 month sugar average.  Also for hx of dyspnea on exertion get cxr.  Briefly discussed mild joint pains. Decided not indicated to do inflammatory labs. But would get in future if joint pains worsen.    Preventive Care 66 Years and Older, Male Preventive care refers to lifestyle choices and visits with your health care provider that can promote health and wellness. This includes: A yearly physical exam. This is also called an annual wellness visit. Regular dental and eye exams. Immunizations. Screening for certain conditions. Healthy lifestyle choices, such as: Eating a healthy diet. Getting regular exercise. Not using drugs or products that contain nicotine and tobacco. Limiting alcohol use. What can I expect for my preventive care visit? Physical exam Your health care provider will check your: Height and weight. These may be used to calculate your BMI (body mass index). BMI is a measurement that tells if you are at a healthy weight. Heart rate and blood pressure. Body temperature. Skin for abnormal spots. Counseling Your health care provider may ask you questions about your: Past medical problems. Family's medical history. Alcohol, tobacco, and drug use. Emotional well-being. Home life and relationship well-being. Sexual activity. Diet, exercise, and sleep habits. History of falls. Memory and ability to understand (cognition). Work and  work Astronomer. Access to firearms. What immunizations do I need? Vaccines are usually given at various ages, according to a schedule. Your health care provider will recommend vaccines for you based on your age, medical history, and lifestyle or other factors, such as travel or where you work. What tests do I need? Blood tests Lipid and cholesterol levels. These may be checked every 5 years, or more often depending on your overall health. Hepatitis C test. Hepatitis B test. Screening Lung cancer screening. You may have this screening every year starting at age 66 if you have a 30-pack-year history of smoking and currently smoke or have quit within the past 15 years. Colorectal cancer screening. All adults should have this screening starting at age 40 and continuing until age 66. Your health care provider may recommend screening at age 80 if you are at increased risk. You will have tests every 1-10 years, depending on your results and the type of screening test. Prostate cancer screening. Recommendations will vary depending on your family history and other risks. Genital exam to check for testicular cancer or hernias. Diabetes screening. This is done by checking your blood sugar (glucose) after you have not eaten for a while (fasting). You may have this done every 1-3 years. Abdominal aortic aneurysm (AAA) screening. You may need this if you are a current or former smoker. STD (sexually transmitted disease) testing, if you are at risk. Follow these instructions at home: Eating and drinking  Eat a diet that includes fresh fruits and vegetables, whole grains, lean protein, and low-fat dairy products. Limit your intake of foods with high amounts of sugar, saturated fats, and salt. Take vitamin and mineral  supplements as recommended by your health care provider. Do not drink alcohol if your health care provider tells you not to drink. If you drink alcohol: Limit how much you have to 0-2  drinks a day. Be aware of how much alcohol is in your drink. In the [place] one drink equals one 12 oz bottle of beer (355 mL), one 5 oz glass of wine (148 mL), or one 1 oz glass of hard liquor (44 mL). Lifestyle Take daily care of your teeth and gums. Brush your teeth every morning and night with fluoride toothpaste. Floss one time each day. Stay active. Exercise for at least 30 minutes 5 or more days each week. Do not use any products that contain nicotine or tobacco, such as cigarettes, e-cigarettes, and chewing tobacco. If you need help quitting, ask your health care provider. Do not use drugs. If you are sexually active, practice safe sex. Use a condom or other form of protection to prevent STIs (sexually transmitted infections). Talk with your health care provider about taking a low-dose aspirin or statin. Find healthy ways to cope with stress, such as: Meditation, yoga, or listening to music. Journaling. Talking to a trusted person. Spending time with friends and family. Safety Always wear your seat belt while driving or riding in a vehicle. Do not drive: If you have been drinking alcohol. Do not ride with someone who has been drinking. When you are tired or distracted. While texting. Wear a helmet and other protective equipment during sports activities. If you have firearms in your house, make sure you follow all gun safety procedures. What's next? Visit your health care provider once a year for an annual wellness visit. Ask your health care provider how often you should have your eyes and teeth checked. Stay up to date on all vaccines. This information is not intended to replace advice given to you by your health care provider. Make sure you discuss any questions you have with your health care provider. Document Revised: 03/29/2020 Document Reviewed: 01/13/2018 Elsevier Patient Education  2022 [employer].

## 2020-11-12 NOTE — Progress Notes (Signed)
Subjective:    Patient ID: Steven Goodwin, male    DOB: 1955/04/30, 65 y.o.   MRN: [id number]  HPI  Pt in for cpe/wellness. Last seen by pcp in 2020.  He is working at [employer]. Pt states not exercising regular but is doing yard work. Pt never smoked. No alcohol use. Pt drinks 3 sodas/pepsi a day.     Pt states his brother recently had quadruple bypass. Pt brother 84 year old. His brother nonsmoker. Pt mom or dad no CAD. Dad did need pacemaker. Pt has some cousins on mom side that needed bypass.   Pt has mild elevated sugar in past.   Pt family CAD issues causes concern. Pt not having any random chest pains or cardiac associated signs or symptoms.     Pt had colonoscopy age 25 yo. None since. His last one canceled due to covid.  Pt had 2 covid vaccines.Pt plans on getting booster.   Will get flu vaccine today.  Mild dyspnea on exertion throughout his life since youth. No hx of asthma. No report of pedal edema.    Review of Systems  Constitutional:  Negative for chills, fatigue and fever.  HENT:  Negative for dental problem, ear discharge, ear pain, hearing loss and mouth sores.   Respiratory:  Negative for cough, chest tightness, shortness of breath and wheezing.   Cardiovascular:  Negative for chest pain and palpitations.  Gastrointestinal:  Negative for abdominal pain, blood in stool, nausea and rectal pain.  Genitourinary:  Positive for frequency. Negative for decreased urine volume, difficulty urinating, enuresis, flank pain, penile discharge, penile pain, penile swelling and testicular pain.  Musculoskeletal:  Positive for arthralgias. Negative for back pain, gait problem and myalgias.       Mild joint pains. Mom has arthritis.  Skin:  Negative for rash.  Neurological:  Negative for facial asymmetry and light-headedness.  Psychiatric/Behavioral:  Negative for behavioral problems, decreased concentration, dysphoric mood, hallucinations and sleep disturbance. The  patient is not nervous/anxious.      Past Medical History:  Diagnosis Date   Exercise-induced asthma 11/01/2017   History of chicken pox    History of shingles    Hyperglycemia    Hyperlipidemia 11/01/2017     Social History   Socioeconomic History   Marital status: Married    Spouse name: Not on file   Number of children: Not on file   Years of education: Not on file   Highest education level: Not on file  Occupational History   Not on file  Tobacco Use   Smoking status: Never   Smokeless tobacco: Never  Substance and Sexual Activity   Alcohol use: Not Currently   Drug use: Never   Sexual activity: Yes  Other Topics Concern   Not on file  Social History Narrative   Not on file   Social Determinants of Health   Financial Resource Strain: Not on file  Food Insecurity: Not on file  Transportation Needs: Not on file  Physical Activity: Not on file  Stress: Not on file  Social Connections: Not on file  Intimate Partner Violence: Not on file    Past Surgical History:  Procedure Laterality Date   TONSILLECTOMY     childhood    Family History  Problem Relation Age of Onset   Heart failure Mother    Heart failure Father    Mental illness Sister     Not on File  Current Outpatient Medications on File Prior to Visit  Medication Sig Dispense Refill   albuterol (PROVENTIL HFA;VENTOLIN HFA) 108 (90 Base) MCG/ACT inhaler PLEASE SEE ATTACHED FOR DETAILED DIRECTIONS 6.7 Inhaler 1   gabapentin (NEURONTIN) 100 MG capsule TAKE 2 CAPSULES (200 MG TOTAL) BY MOUTH AT BEDTIME. 180 capsule 2   gabapentin (NEURONTIN) 100 MG capsule TAKE 2 CAPSULES BY MOUTH AT BEDTIME. 180 capsule 2   methocarbamol (ROBAXIN) 500 MG tablet TAKE 1 TABLET BY MOUTH AT BEDTIME AS NEEDED 30 tablet 5   No current facility-administered medications on file prior to visit.    BP 110/73   Pulse (!) 58   Resp 18   Ht 5\' 10"  (1.778 m)   Wt 207 lb (93.9 kg)   SpO2 99%   BMI 29.70 kg/m         Objective:   Physical Exam  General Mental Status- Alert. General Appearance- Not in acute distress.   Skin General: Color- Normal Color. Moisture- Normal Moisture.  Neck Carotid Arteries- Normal color. Moisture- Normal Moisture. No carotid bruits. No JVD.  Chest and Lung Exam Auscultation: Breath Sounds:-Normal.  Cardiovascular Auscultation:Rythm- Regular. Murmurs & Other Heart Sounds:Auscultation of the heart reveals- No Murmurs.  Abdomen Inspection:-Inspeection Normal. Palpation/Percussion:Note:No mass. Palpation and Percussion of the abdomen reveal- Non Tender, Non Distended + BS, no rebound or guarding.   Neurologic Cranial Nerve exam:- CN III-XII intact(No nystagmus), symmetric smile. Strength:- 5/5 equal and symmetric strength both upper and lower extremities.   Lower ext- no pedal edema.      Assessment & Plan:   Patient Instructions  For you wellness exam today I have ordered cbc, cmp, lipid panel and psa.  Vaccine given today flu vaccine.  Recommend exercise and healthy diet.  We will let you know lab results as they come in.  Referral placed for colonoscopy.  Placed referral to derm for skin cancer screening and to evaluate probable seborrheic keratosis lesion on forearm.  Follow up date appointment will be determined after lab review.    For family history of CAD/bypass and dyspnea on exertion plan to refer you cardilogist after review of cholesterol level and 3 month sugar average.     , Esperanza Richters   [place] charge as addressed fh of CAD/bypass, got ekg, cxr and plan on referring to cardiologist.

## 2020-11-12 NOTE — Addendum Note (Signed)
Addended by: Gwenevere Abbot on: 11/12/2020 10:23 PM   Modules accepted: Orders

## 2020-11-21 ENCOUNTER — Other Ambulatory Visit (HOSPITAL_BASED_OUTPATIENT_CLINIC_OR_DEPARTMENT_OTHER): Payer: Self-pay

## 2020-11-21 MED ORDER — MODERNA COVID-19 BIVAL BOOSTER 50 MCG/0.5ML IM SUSP
INTRAMUSCULAR | 0 refills | Status: AC
  Filled 2020-11-21: qty 0.5, 1d supply, fill #0

## 2020-11-24 ENCOUNTER — Encounter: Payer: Self-pay | Admitting: Cardiology

## 2020-11-24 DIAGNOSIS — R0789 Other chest pain: Secondary | ICD-10-CM | POA: Insufficient documentation

## 2020-11-24 DIAGNOSIS — R0609 Other forms of dyspnea: Secondary | ICD-10-CM | POA: Insufficient documentation

## 2020-11-24 DIAGNOSIS — Z8249 Family history of ischemic heart disease and other diseases of the circulatory system: Secondary | ICD-10-CM | POA: Insufficient documentation

## 2020-11-24 NOTE — Progress Notes (Signed)
Primary Care Provider: Bradd Canary, MD [facility] Cardiologist: None Electrophysiologist: None  Clinic Note: Chief Complaint  Patient presents with   New Patient (Initial Visit)    Baseline cardiovascular evaluation-brother just had MI     ===================================  ASSESSMENT/PLAN   Problem List Items Addressed This Visit       Cardiology Problems   Hyperlipidemia (Chronic)    Lipid panel actually looks pretty good were not for him to have the new risk factor of family history of CAD.  For baseline cardiovascular risk assessment, will check CORONARY CALCIUM SCORE        Other   Dyspnea on exertion    Chronic issue, however given the fact that his brother had relatively indolent onset of CAD, need to exclude cardiac etiology.  Exam was relatively benign, however need to exclude cardiomyopathy.  Plan: Check 2D echo      Relevant Orders   EKG 12-Lead   CT CARDIAC SCORING (SELF PAY ONLY)   ECHOCARDIOGRAM COMPLETE   Family history of early CAD - Primary (Chronic)    But much asymptomatic.  He does have some exertional dyspnea but that is longstanding and not any worse than it had been.  Since his brothers MI was relatively sudden onset, he is concerned and where they are frequently based on evaluation with CORONARY CALCIUM SCORE      Relevant Orders   EKG 12-Lead   CT CARDIAC SCORING (SELF PAY ONLY)   ECHOCARDIOGRAM COMPLETE   ===================================  HPI:    Steven Goodwin is a 65 y.o. male who is being seen today for the evaluation of RANDOM CHEST PAIN, SHORTNESS OF BREATH, and FAMILY HISTORY OF CAD at the request of Saguier, Ramon Dredge, [place].  Furious Chiarelli was seen on 11/12/2020 Saguier, Ramon Dredge, PA-C for annual wellness exam.  Noted that his younger brother just had quadruple bypass (age 59), and other distant history of heart disease.  Patient was off his relatively active but does not exercise routinely, he does yard work.  He  notes random episodes of chest pain over the last couple years as well as longstanding dyspnea since childhood (was told he had exercise-induced asthma).  No edema. -> Referred to cardiology because of family history of CAD, and dyspnea as well as random chest pain.;  Lipid panel and blood sugars ordered.  Recent Hospitalizations: None  Reviewed  CV studies:    The following studies were reviewed today: (if available, images/films reviewed: From Epic Chart or Care Everywhere) None:   Interval History:   Steven Goodwin presents here today indicating that the main reason he is here because because his brother who is 4 years younger than him just had a heart attack and ended up going for CABG after attempted PCI was unsuccessful.  This was a sudden onset event for his brother, and now he is wanting to take precautions.  He has had basically no concerning symptoms.  No chest pain or pressure with rest or exertion and he is quite active walking maybe 3 miles couple days a week.  He used to be much more active but has cut that down a bit.  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  He also noted that with walking or running he will get short of breath at peak exercise.  His son was recently diagnosed with exercise-induced asthma.  His  symptoms are very similar to that.  Otherwise, he has some mild aches and pains if he lifts something heavy, will notice some bilateral chest discomfort but nothing exertional.  Usually after he exerts.  CV Review of Symptoms (Summary) Cardiovascular ROS: positive for - -chronic exertional dyspnea and off-and-on mild chest twinges, usually after lifting heavy objects. negative for - edema, irregular heartbeat, orthopnea, palpitations, paroxysmal nocturnal dyspnea, rapid heart rate, shortness of breath, or syncope/near syncope or TIA/amaurosis fugax, claudication  REVIEWED OF SYSTEMS    Review of Systems  Constitutional:  Negative for malaise/fatigue.  HENT:  Negative for congestion.   Respiratory:  Positive for wheezing (If he overexerts himself.  This is a ongoing symptom since childhood). Negative for cough.   Cardiovascular:        Per HPI  Gastrointestinal:  Negative for blood in stool and melena.  Genitourinary:  Positive for frequency. Negative for hematuria.  Musculoskeletal:  Positive for joint pain (Mild arthritis pains.).  Neurological:  Negative for dizziness and focal weakness.  Psychiatric/Behavioral:  Negative for depression and memory loss. The patient is not nervous/anxious and does not have insomnia.     I have reviewed and (if needed) personally updated the patient's problem list, medications, allergies, past medical and surgical history, social and family history.   PAST MEDICAL HISTORY   Past Medical History:  Diagnosis Date   Exercise-induced asthma 11/01/2017   History of chicken pox    History of shingles    Hyperglycemia    Hyperlipidemia 11/01/2017    PAST SURGICAL HISTORY   Past Surgical History:  Procedure Laterality Date   TONSILLECTOMY     childhood    Immunization History  Administered Date(s) Administered   Fluad Quad(high Dose 65+) 11/12/2020   Influenza, Quadrivalent, Recombinant, Inj, Pf 11/30/2018   Moderna Covid-19 Vaccine Bivalent Booster 22yrs & up 11/12/2020   PFIZER(Purple Top)SARS-COV-2 Vaccination 01/08/2020   Tdap 11/01/2017   Zoster Recombinat (Shingrix) 11/30/2018    MEDICATIONS/ALLERGIES   Current Meds  Medication Sig   gabapentin (NEURONTIN) 100 MG capsule TAKE 2 CAPSULES (200 MG TOTAL) BY MOUTH AT BEDTIME.    No Known Allergies  SOCIAL HISTORY/FAMILY HISTORY   Reviewed in Epic:   Social History   Tobacco Use   Smoking status: Never   Smokeless tobacco: Never  Substance Use Topics   Alcohol use: Not Currently   Drug use: Never   Social History   Social History Narrative      He is  working at [employer]. Pt states not exercising regular but is doing yard work. Pt never smoked. No alcohol use. Pt drinks 3 sodas/pepsi a day.        Family History  Problem Relation Age of Onset   Heart failure Mother    Heart failure Father    Heart disease Father        Had pacemaker   Mental illness Sister    Coronary artery disease Brother 26       Four-vessel CABG    OBJCTIVE -PE, EKG, labs   Wt Readings from Last 3 Encounters:  11/25/20 209 lb 3.2 oz (94.9 kg)  11/12/20 207 lb (93.9 kg)  07/04/20 211 lb (95.7 kg)    Physical Exam: BP 114/82 (BP Location: Right Arm, Patient Position: Sitting, Cuff Size: Normal)   Pulse 88   Ht 5\' 10"  (1.778 m)   Wt 209 lb 3.2 oz (94.9 kg)   SpO2 96%   BMI 30.02 kg/m  Physical Exam  Constitutional:      General: He is not in acute distress.    Appearance: Normal appearance. He is obese. He is not ill-appearing or toxic-appearing.  HENT:     Head: Normocephalic and atraumatic.  Eyes:     Extraocular Movements: Extraocular movements intact.     Pupils: Pupils are equal, round, and reactive to light.     Comments: Wears glasses  Neck:     Vascular: No carotid bruit.  Cardiovascular:     Rate and Rhythm: Normal rate and regular rhythm.     Pulses: Normal pulses.     Heart sounds: Normal heart sounds. No murmur heard.   No friction rub. No gallop.  Pulmonary:     Effort: Pulmonary effort is normal. No respiratory distress.     Breath sounds: Normal breath sounds.  Chest:     Chest wall: No tenderness.  Musculoskeletal:        General: No swelling. Normal range of motion.     Cervical back: Normal range of motion and neck supple.  Skin:    General: Skin is warm and dry.  Neurological:     General: No focal deficit present.     Mental Status: He is alert and oriented to person, place, and time.     Cranial Nerves: No cranial nerve deficit.     Gait: Gait normal.  Psychiatric:        Mood and Affect: Mood normal.         Behavior: Behavior normal.        Thought Content: Thought content normal.        Judgment: Judgment normal.     Adult ECG Report  Rate: 84 ;  Rhythm: normal sinus rhythm and normal axis, intervals and durations. ;   Narrative Interpretation: Normal EKG  Recent Labs: Reviewed Lab Results  Component Value Date   CHOL 154 11/12/2020   HDL 39.00 (L) 11/12/2020   LDLCALC 95 11/12/2020   TRIG 96.0 11/12/2020   CHOLHDL 4 11/12/2020   Lab Results  Component Value Date   CREATININE 1.04 11/12/2020   BUN 10 11/12/2020   NA 139 11/12/2020   K 4.4 11/12/2020   CL 103 11/12/2020   CO2 29 11/12/2020   CBC Latest Ref Rng & Units 11/12/2020 11/01/2017  WBC 4.0 - 10.5 K/uL 4.6 7.3  Hemoglobin 13.0 - 17.0 g/dL 02.7 74.1  Hematocrit 28.7 - 52.0 % 45.4 44.9  Platelets 150.0 - 400.0 K/uL 234.0 275.0    Lab Results  Component Value Date   HGBA1C 5.7 11/12/2020   Lab Results  Component Value Date   TSH 3.54 11/01/2017    ==================================================  COVID-19 Education: The signs and symptoms of COVID-19 were discussed with the patient and how to seek care for testing (follow up with PCP or arrange E-visit).    I spent a total of 25 minutes with the patient spent in direct patient consultation.  Additional time spent with chart review  / charting (studies, outside notes, etc): 18 min Total Time: 43 min  Current medicines are reviewed at length with the patient today.  (+/- concerns) none  This visit occurred during the SARS-CoV-2 public health emergency.  Safety protocols were in place, including screening questions prior to the visit, additional usage of staff PPE, and extensive cleaning of exam room while observing appropriate contact time as indicated for disinfecting solutions.  Notice: This dictation was prepared with Dragon dictation along with smart phrase technology. Any transcriptional  errors that result from this process are unintentional and may not  be corrected upon review.   Studies Ordered:  Orders Placed This Encounter  Procedures   CT CARDIAC SCORING (SELF PAY ONLY)   EKG 12-Lead   ECHOCARDIOGRAM COMPLETE     Patient Instructions / Medication Changes & Studies & Tests Ordered   Patient Instructions  Medication Instructions:  No changes  *If you need a refill on your cardiac medications before your next appointment, please call your pharmacy*   Lab Work:  Not needed   Testing/Procedures: Both will be scheduel at [address] Your physician has requested that you have an echocardiogram. Echocardiography is a painless test that uses sound waves to create images of your heart. It provides your doctor with information about the size and shape of your heart and how well your heart's chambers and valves are working. This procedure takes approximately one hour. There are no restrictions for this procedure.  And  CT coronary calcium score. This test is done at [address]. This is $99 out of pocket.    Follow-Up: At [facility], you and your health needs are our priority.  As part of our continuing mission to provide you with exceptional heart care, we have created designated Provider Care Teams.  These Care Teams include your primary Cardiologist (physician) and Advanced Practice Providers (APPs -  Physician Assistants and Nurse Practitioners) who all work together to provide you with the care you need, when you need it.     Your next appointment:   3 month(s)  The format for your next appointment:   Virtual or in person   Provider:   Bryan Lemma, MD     Bryan Lemma, M.D., M.S. Interventional Cardiologist   Pager # [phone] Phone # [phone] [address] [place], [place] [address]   Thank you for choosing Heartcare at [facility]!!

## 2020-11-25 ENCOUNTER — Other Ambulatory Visit: Payer: Self-pay

## 2020-11-25 ENCOUNTER — Encounter: Payer: Self-pay | Admitting: Cardiology

## 2020-11-25 ENCOUNTER — Ambulatory Visit: Payer: BC Managed Care – PPO | Admitting: Cardiology

## 2020-11-25 VITALS — BP 114/82 | HR 88 | Ht 70.0 in | Wt 209.2 lb

## 2020-11-25 DIAGNOSIS — R0609 Other forms of dyspnea: Secondary | ICD-10-CM | POA: Diagnosis not present

## 2020-11-25 DIAGNOSIS — R0789 Other chest pain: Secondary | ICD-10-CM

## 2020-11-25 DIAGNOSIS — Z8249 Family history of ischemic heart disease and other diseases of the circulatory system: Secondary | ICD-10-CM

## 2020-11-25 DIAGNOSIS — E782 Mixed hyperlipidemia: Secondary | ICD-10-CM

## 2020-11-25 NOTE — Assessment & Plan Note (Signed)
Chronic issue, however given the fact that his brother had relatively indolent onset of CAD, need to exclude cardiac etiology.  Exam was relatively benign, however need to exclude cardiomyopathy.  Plan: Check 2D echo

## 2020-11-25 NOTE — Assessment & Plan Note (Signed)
Lipid panel actually looks pretty good were not for him to have the new risk factor of family history of CAD.  For baseline cardiovascular risk assessment, will check CORONARY CALCIUM SCORE

## 2020-11-25 NOTE — Assessment & Plan Note (Signed)
But much asymptomatic.  He does have some exertional dyspnea but that is longstanding and not any worse than it had been.  Since his brothers MI was relatively sudden onset, he is concerned and where they are frequently based on evaluation with CORONARY CALCIUM SCORE

## 2020-11-25 NOTE — Patient Instructions (Signed)
Medication Instructions:  No changes  *If you need a refill on your cardiac medications before your next appointment, please call your pharmacy*   Lab Work:  Not needed   Testing/Procedures: Both will be scheduel at [facility] [address] Your physician has requested that you have an echocardiogram. Echocardiography is a painless test that uses sound waves to create images of your heart. It provides your doctor with information about the size and shape of your heart and how well your heart's chambers and valves are working. This procedure takes approximately one hour. There are no restrictions for this procedure.  And  CT coronary calcium score. This test is done at [address]. This is $99 out of pocket.   Coronary CalciumScan A coronary calcium scan is an imaging test used to look for deposits of calcium and other fatty materials (plaques) in the inner lining of the blood vessels of the heart (coronary arteries). These deposits of calcium and plaques can partly clog and narrow the coronary arteries without producing any symptoms or warning signs. This puts a person at risk for a heart attack. This test can detect these deposits before symptoms develop. Tell a health care provider about: Any allergies you have. All medicines you are taking, including vitamins, herbs, eye drops, creams, and over-the-counter medicines. Any problems you or family members have had with anesthetic medicines. Any blood disorders you have. Any surgeries you have had. Any medical conditions you have. Whether you are pregnant or may be pregnant. What are the risks? Generally, this is a safe procedure. However, problems may occur, including: Harm to a pregnant woman and her unborn baby. This test involves the use of radiation. Radiation exposure can be dangerous to a pregnant woman and her unborn baby. If you are pregnant, you generally should not have this procedure  done. Slight increase in the risk of cancer. This is because of the radiation involved in the test. What happens before the procedure? No preparation is needed for this procedure. What happens during the procedure? You will undress and remove any jewelry around your neck or chest. You will put on a hospital gown. Sticky electrodes will be placed on your chest. The electrodes will be connected to an electrocardiogram (ECG) machine to record a tracing of the electrical activity of your heart. A CT scanner will take pictures of your heart. During this time, you will be asked to lie still and hold your breath for 2-3 seconds while a picture of your heart is being taken. The procedure may vary among health care providers and hospitals. What happens after the procedure? You can get dressed. You can return to your normal activities. It is up to you to get the results of your test. Ask your health care provider, or the department that is doing the test, when your results will be ready. Summary A coronary calcium scan is an imaging test used to look for deposits of calcium and other fatty materials (plaques) in the inner lining of the blood vessels of the heart (coronary arteries). Generally, this is a safe procedure. Tell your health care provider if you are pregnant or may be pregnant. No preparation is needed for this procedure. A CT scanner will take pictures of your heart. You can return to your normal activities after the scan is done. This information is not intended to replace advice given to you by your health care provider. Make sure you discuss any questions you have  with your health care provider. Document Released: 07/18/2007 Document Revised: 12/09/2015 Document Reviewed: 12/09/2015 Elsevier Interactive Patient Education  2017 [employer].     Follow-Up: At [facility], you and your health needs are our priority.  As part of our continuing mission to provide you with exceptional  heart care, we have created designated Provider Care Teams.  These Care Teams include your primary Cardiologist (physician) and Advanced Practice Providers (APPs -  Physician Assistants and Nurse Practitioners) who all work together to provide you with the care you need, when you need it.     Your next appointment:   3 month(s)  The format for your next appointment:   Virtual or in person   Provider:   Bryan Lemma, MD

## 2020-12-04 ENCOUNTER — Other Ambulatory Visit (HOSPITAL_COMMUNITY): Payer: BC Managed Care – PPO

## 2020-12-05 ENCOUNTER — Other Ambulatory Visit: Payer: BC Managed Care – PPO

## 2020-12-06 ENCOUNTER — Ambulatory Visit (HOSPITAL_COMMUNITY): Payer: BC Managed Care – PPO | Attending: Internal Medicine

## 2020-12-06 ENCOUNTER — Other Ambulatory Visit: Payer: Self-pay

## 2020-12-06 ENCOUNTER — Ambulatory Visit (INDEPENDENT_AMBULATORY_CARE_PROVIDER_SITE_OTHER)
Admission: RE | Admit: 2020-12-06 | Discharge: 2020-12-06 | Disposition: A | Discharge status: Home / Self Care | Payer: Self-pay | Source: Ambulatory Visit | Attending: Cardiovascular Disease | Admitting: Cardiovascular Disease

## 2020-12-06 DIAGNOSIS — Z8249 Family history of ischemic heart disease and other diseases of the circulatory system: Secondary | ICD-10-CM | POA: Insufficient documentation

## 2020-12-06 DIAGNOSIS — R931 Abnormal findings on diagnostic imaging of heart and coronary circulation: Secondary | ICD-10-CM | POA: Insufficient documentation

## 2020-12-06 DIAGNOSIS — R0609 Other forms of dyspnea: Secondary | ICD-10-CM

## 2020-12-06 HISTORY — PX: TRANSTHORACIC ECHOCARDIOGRAM: SHX275

## 2020-12-06 LAB — ECHOCARDIOGRAM COMPLETE
Area-P 1/2: 1.91 cm2
S' Lateral: 3.3 cm

## 2020-12-11 ENCOUNTER — Ambulatory Visit: Payer: BC Managed Care – PPO | Admitting: Cardiovascular Disease

## 2021-02-07 ENCOUNTER — Other Ambulatory Visit: Payer: Self-pay

## 2021-02-07 ENCOUNTER — Telehealth (INDEPENDENT_AMBULATORY_CARE_PROVIDER_SITE_OTHER): Payer: BC Managed Care – PPO | Admitting: Cardiology

## 2021-02-07 ENCOUNTER — Encounter: Payer: Self-pay | Admitting: Cardiology

## 2021-02-07 VITALS — Ht 70.0 in | Wt 195.0 lb

## 2021-02-07 DIAGNOSIS — E785 Hyperlipidemia, unspecified: Secondary | ICD-10-CM | POA: Diagnosis not present

## 2021-02-07 DIAGNOSIS — R931 Abnormal findings on diagnostic imaging of heart and coronary circulation: Secondary | ICD-10-CM | POA: Diagnosis not present

## 2021-02-07 DIAGNOSIS — Z8249 Family history of ischemic heart disease and other diseases of the circulatory system: Secondary | ICD-10-CM | POA: Diagnosis not present

## 2021-02-07 DIAGNOSIS — E782 Mixed hyperlipidemia: Secondary | ICD-10-CM

## 2021-02-07 MED ORDER — ROSUVASTATIN CALCIUM 20 MG PO TABS
20.0000 mg | ORAL_TABLET | Freq: Every day | ORAL | 3 refills | Status: DC
Start: 2021-02-07 — End: 2021-11-06

## 2021-02-07 MED ORDER — ASPIRIN EC 81 MG PO TBEC: 81.0000 mg | DELAYED_RELEASE_TABLET | Freq: Every day | ORAL | 3 refills | Status: AC

## 2021-02-07 NOTE — Progress Notes (Signed)
Virtual Visit via Video Note   This visit type was conducted due to national recommendations for restrictions regarding the COVID-19 Pandemic (e.g. social distancing) in an effort to limit this patient's exposure and mitigate transmission in our community.  Due to his co-morbid illnesses, this patient is at least at moderate risk for complications without adequate follow up.  This format is felt to be most appropriate for this patient at this time.  All issues noted in this document were discussed and addressed.  A limited physical exam was performed with this format.  Please refer to the patient's chart for his consent to telehealth for [facility].      Patient has given verbal permission to conduct this visit via virtual appointment and to bill insurance 02/08/2021 7:23 PM     Evaluation Performed:  Follow-up visit  Date:  02/08/2021   ID:  Wyline Copas, DOB 19-Jun-1955, MRN [id number]  Patient Location: Home Provider Location: Home Office  PCP:  Mosie Lukes, MD  Cardiologist:  None  Electrophysiologist:  None   Chief Complaint:   Chief Complaint  Patient presents with   Follow-up    Test results.   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  Close enough to 300 to recommend 81 mg aspirin, especially in light of family history.  BP stable on no medications.  No true indication to start medication at this time, low threshold to consider beta-blocker  Plan: Recommend 81 mg aspirin, and start rosuvastatin 20 mg daily.      Relevant Medications   rosuvastatin (CRESTOR) 20 MG tablet   aspirin EC 81 MG tablet   Other Relevant Orders   Hepatic function panel   Lipid panel   Hyperlipidemia with target LDL less than 70 (Chronic)    With family history of CAD and Corta-Cap score of 280.   We discussed goals of care and for step would be to try to reduce LDL level to below 70.  Most recently was 90 on no medication.  Plan: Initiate rosuvastatin 20 mg daily. Should be due for recheck lipids in June July timeframe.      Relevant Medications   rosuvastatin (CRESTOR) 20 MG tablet   aspirin EC 81 MG tablet   Other Relevant Orders   Hepatic function panel   Lipid panel     Other   Family history of early CAD (Chronic)    Justifiably concerned.  Somewhat reassuring that Coronary Calcium Score is over 200 but this is moderate risk.  Will initiate statin therapy and start aspirin.  Follow symptoms but low threshold to consider Coronary CTA if symptoms warrant.      Relevant Orders   Hepatic function panel   Lipid panel    ====================================  History of Present Illness:    Cru Bordas is a 66 y.o. male with PMH notable for Family History of CAD who presents via audio/video conferencing for a telehealth visit today as a 42-month follow-up to discuss results of his Coronary Calcium Score.Wyline Copas was seen \\on  11/25/2020 for initial evaluation given his family history of CAD-Brother recently diagnosed of MI heart cardiac cath.  He noted occasional exertional dyspnea and random episodes of chest pain.  Symptoms were not necessarily consistent with angina.   => 2D Echo and  Coronary Calcium Score ordered for restratification.Marland Kitchen  Hospitalizations:  None   Recent - Interim CV studies:   The following studies were reviewed today: TTE 27-Dec-2020:: EF 50 to 55%.  GR 1 DD.  No RWM A.  Normal valves. Coronary Calcium Score 2020/12/27: 283 (LDL 95, RCA 177, LCx 10)  Inerval History   Brandyn Lopresti Is being seen today via telemedicine for 65-month follow-up to discuss results of his studies.  He overall is doing very well.  He is with his family out in [place] staying at his mother-in-law's house.  She has been sick and in the hospital.  They have been helping as  caregivers.  He has been pretty active and not having any significant symptoms of chest tightness or pressure with rest or exertion.  He is always had symptoms of what sounds like exercise-induced asthma with wheezing if he overexerts, but is not really even been doing enough to have that symptom since have seen him.   Cardiovascular ROS: positive for - dyspnea on exertion and This is a chronic symptoms consistent with exercise-induced asthma.  Has had it since his teenage years.  Also occasionally has a chest discomfort if he is lifting heavy objects. negative for - chest pain, edema, irregular heartbeat, orthopnea, palpitations, paroxysmal nocturnal dyspnea, rapid heart rate, shortness of breath, or Lightheadedness or dizziness wooziness, syncope/near syncope or TIA/amaurosis fugax, claudication   ROS:  Please see the history of present illness.     Pertinent symptoms noted above.  Other noncardiac symptoms noted are occasional urinary frequency and mild joint pain/arthritis pains.  Exertional dyspnea/wheezing related to chronic exercise-induced asthma.  Past Medical History:  Diagnosis Date   Exercise-induced asthma 11/01/2017   History of chicken pox    History of shingles    Hyperglycemia    Hyperlipidemia 11/01/2017   Past Surgical History:  Procedure Laterality Date   TONSILLECTOMY     childhood   TRANSTHORACIC ECHOCARDIOGRAM  Dec 27, 2020   20EF 50 to 55%.  GR 1 DD.  No RWM A.  Normal valves.     Current Meds  Medication Sig   aspirin EC 81 MG tablet Take 1 tablet (81 mg total) by mouth daily.     Allergies:   Patient has no known allergies.   Social History   Tobacco Use   Smoking status: Never   Smokeless tobacco: Never  Substance Use Topics   Alcohol use: Not Currently   Drug use: Never     Family Hx: The patient's family history includes Coronary artery disease (age of onset: 36) in his brother; Heart disease in his father; Heart failure in his father and mother;  Mental illness in his sister.   Labs/Other Tests and Data Reviewed:    EKG:  No ECG reviewed.  Recent Labs: 11/12/2020: ALT 16; BUN 10; Creatinine, Ser 1.04; Hemoglobin 15.4; Platelets 234.0; Potassium 4.4; Sodium 139   Recent Lipid Panel Lab Results  Component Value Date/Time   CHOL 154 11/12/2020 08:54 AM   TRIG 96.0 11/12/2020 08:54 AM   HDL 39.00 (L) 11/12/2020 08:54 AM   CHOLHDL 4 11/12/2020 08:54 AM   LDLCALC 95 11/12/2020 08:54 AM    Wt Readings from Last 3 Encounters:  02/07/21 195 lb (88.5 kg)  11/25/20 209 lb 3.2 oz (94.9 kg)  11/12/20 207 lb (93.9 kg)     Objective:    Vital Signs:  Ht 5\' 10"  (1.778 m)    Wt 195 lb (88.5 kg)    BMI 27.98  kg/m   VITAL SIGNS:  reviewed GEN:  no acute distress RESPIRATORY:  normal respiratory effort, symmetric expansion NEURO:  alert and oriented x 3, no obvious focal deficit PSYCH:  normal affect   ==========================================  COVID-19 Education: The signs and symptoms of COVID-19 were discussed with the patient and how to seek care for testing (follow up with PCP or arrange E-visit).   The importance of social distancing was discussed today.  Time:   Today, I have spent 23 minutes with the patient with telehealth technology discussing the above problems.   -> We discussed the pathophysiology of atherosclerotic heart disease and what the Coronary Calcium Score means. An additional 10 minutes spent charting (reviewing prior notes, hospital records, studies, labs etc.) Total 27minutes   Medication Adjustments/Labs and Tests Ordered: Current medicines are reviewed at length with the patient today.  Concerns regarding medicines are outlined above.   Patient Instructions  Medication Instructions:   Rosuvastatin 20 mg one tablet daily preferably at bedtime.  Aspirin 81 mg -Enteric coated Asprin one tablet a day    *If you need a refill on your cardiac medications before your next appointment, please call  your pharmacy*   Lab Work: Lipid  Hepatic - June or July of 2023- fasting  ( you can have lab work here at the office - no appointment is necessary or you may go to any [facility]  patient drawstation office )  If you have labs (blood work) drawn today and your tests are completely normal, you will receive your results only by: [place] (if you have MyChart) OR A paper copy in the mail If you have any lab test that is abnormal or we need to change your treatment, we will call you to review the results.   Testing/Procedures: Not needed    Follow-Up: At [facility], you and your health needs are our priority.  As part of our continuing mission to provide you with exceptional heart care, we have created designated Provider Care Teams.  These Care Teams include your primary Cardiologist (physician) and Advanced Practice Providers (APPs -  Physician Assistants and Nurse Practitioners) who all work together to provide you with the care you need, when you need it.     Your next appointment:   12 month(s)  The format for your next appointment:   In Person  Provider:   None       Signed, Glenetta Hew, MD  02/08/2021 7:23 PM    [place]

## 2021-02-07 NOTE — Patient Instructions (Addendum)
Medication Instructions:   Rosuvastatin 20 mg one tablet daily preferably at bedtime.  Aspirin 81 mg -Enteric coated Asprin one tablet a day    *If you need a refill on your cardiac medications before your next appointment, please call your pharmacy*   Lab Work: Lipid  Hepatic - June or July of 2023- fasting  ( you can have lab work here at the office - no appointment is necessary or you may go to any [facility]  patient drawstation office )  If you have labs (blood work) drawn today and your tests are completely normal, you will receive your results only by: MyChart Message (if you have MyChart) OR A paper copy in the mail If you have any lab test that is abnormal or we need to change your treatment, we will call you to review the results.   Testing/Procedures: Not needed    Follow-Up: At [facility], you and your health needs are our priority.  As part of our continuing mission to provide you with exceptional heart care, we have created designated Provider Care Teams.  These Care Teams include your primary Cardiologist (physician) and Advanced Practice Providers (APPs -  Physician Assistants and Nurse Practitioners) who all work together to provide you with the care you need, when you need it.     Your next appointment:   12 month(s)  The format for your next appointment:   In Person  Provider:   None

## 2021-02-08 ENCOUNTER — Encounter: Payer: Self-pay | Admitting: Cardiology

## 2021-02-08 NOTE — Assessment & Plan Note (Signed)
With family history of CAD and Corta-Cap score of 280.  We discussed goals of care and for step would be to try to reduce LDL level to below 70.  Most recently was 90 on no medication.  Plan: Initiate rosuvastatin 20 mg daily. Should be due for recheck lipids in June July timeframe.

## 2021-02-08 NOTE — Assessment & Plan Note (Signed)
Justifiably concerned.  Somewhat reassuring that Coronary Calcium Score is over 200 but this is moderate risk.  Will initiate statin therapy and start aspirin.  Follow symptoms but low threshold to consider Coronary CTA if symptoms warrant.

## 2021-02-08 NOTE — Assessment & Plan Note (Addendum)
Moderate risk findings on Coronary Calcium Score.  Close enough to 300 to recommend 81 mg aspirin, especially in light of family history.  BP stable on no medications.  No true indication to start medication at this time, low threshold to consider beta-blocker  Plan: Recommend 81 mg aspirin, and start rosuvastatin 20 mg daily.

## 2021-05-01 ENCOUNTER — Other Ambulatory Visit: Payer: Self-pay | Admitting: Family Medicine

## 2021-06-09 ENCOUNTER — Telehealth: Payer: Self-pay

## 2021-06-09 DIAGNOSIS — Z1211 Encounter for screening for malignant neoplasm of colon: Secondary | ICD-10-CM

## 2021-06-09 NOTE — Telephone Encounter (Signed)
Pt called stating he got the automated reminder call stating he was due for a colonoscopy.  Pt stated he is ready to schedule and stated he was due to get one when Covid "broke out."  Pt would like to go ahead with a referral for a colonoscopy. Please advise. ?

## 2021-06-09 NOTE — Telephone Encounter (Signed)
Referral for colonoscopy per pt request. ?

## 2021-07-09 NOTE — Progress Notes (Signed)
[name] [address] [place] [address] Phone: [phone] Subjective:   Steven Goodwin, am serving as a scribe for Dr. Antoine Primas.   I'm seeing this patient by the request  of:  Bradd Canary, MD  CC: low back pain   [id number]  Steven Goodwin is a 66 y.o. male coming in with complaint of lumbar spine pain. Last seen in June 2022. Here for refills. Patient states that he has been doing well. No issues within past year. Sometimes takes an extra pill on days where he does a lot of work.   Patient also notes cracking and popping in neck that has increased over past 2 months. He said that he does not have pain but would like to get ahead of any issues that may be manifesting in this area.   Has been taking gabapentin     Past Medical History:  Diagnosis Date   Exercise-induced asthma 11/01/2017   History of chicken pox    History of shingles    Hyperglycemia    Hyperlipidemia 11/01/2017   Past Surgical History:  Procedure Laterality Date   TONSILLECTOMY     childhood   TRANSTHORACIC ECHOCARDIOGRAM  12/06/2020   20EF 50 to 55%.  GR 1 DD.  No RWM A.  Normal valves.   Social History   Socioeconomic History   Marital status: Married    Spouse name: Not on file   Number of children: Not on file   Years of education: Not on file   Highest education level: Not on file  Occupational History   Not on file  Tobacco Use   Smoking status: Never   Smokeless tobacco: Never  Substance and Sexual Activity   Alcohol use: Not Currently   Drug use: Never   Sexual activity: Yes  Other Topics Concern   Not on file  Social History Narrative      He is working at [employer]. Pt states not exercising regular but is doing yard work. Pt never smoked. No alcohol use. Pt drinks 3 sodas/pepsi a day.        Social Determinants of Health   Financial Resource Strain: Not on file  Food Insecurity: Not on file  Transportation Needs: Not  on file  Physical Activity: Not on file  Stress: Not on file  Social Connections: Not on file   No Known Allergies Family History  Problem Relation Age of Onset   Heart failure Mother    Heart failure Father    Heart disease Father        Had pacemaker   Mental illness Sister    Coronary artery disease Brother 15       Four-vessel CABG     Current Outpatient Medications (Cardiovascular):    rosuvastatin (CRESTOR) 20 MG tablet, Take 1 tablet (20 mg total) by mouth at bedtime.   Current Outpatient Medications (Analgesics):    aspirin EC 81 MG tablet, Take 1 tablet (81 mg total) by mouth daily.   Current Outpatient Medications (Other):    COVID-19 mRNA bivalent vaccine, Moderna, (MODERNA COVID-19 BIVAL BOOSTER) 50 MCG/0.5ML injection, Inject into the muscle.   gabapentin (NEURONTIN) 100 MG capsule, Take 2 capsules (200 mg total) by mouth at bedtime.   influenza vaccine adjuvanted (FLUAD) 0.5 ML injection, Inject into the muscle.    Review of Systems:  No headache, visual changes, nausea, vomiting, diarrhea, constipation, dizziness, abdominal pain, skin rash, fevers, chills, night sweats, weight loss,  swollen lymph nodes, body aches, joint swelling, chest pain, shortness of breath, mood changes. POSITIVE muscle aches  Objective  Blood pressure 116/86, pulse 91, height 5\' 10"  (1.778 m), SpO2 97 %.   General: No apparent distress alert and oriented x3 mood and affect normal, dressed appropriately.  HEENT: Pupils equal, extraocular movements intact  Respiratory: Patient's speak in full sentences and does not appear short of breath  Cardiovascular: No lower extremity edema, non tender, no erythema  Gait normal with good balance and coordination.  MSK: Neck exam does have some very mild loss of lordosis.  Lacks the last 10 degrees of extension.  Some limited left-sided rotation and sidebending. Negative Spurling's Low back exam very unremarkable.  Some mild loss of lordosis and  some mild tightness with FABER test.  [id number]; 15 additional minutes spent for Therapeutic exercises as stated in above notes.  This included exercises focusing on stretching, strengthening, with significant focus on eccentric aspects.   Long term goals include an improvement in range of motion, strength, endurance as well as avoiding reinjury. Patient's frequency would include in 1-2 times a day, 3-5 times a week for a duration of 6-12 weeks. Exercises that included:  Basic scapular stabilization to include adduction and depression of scapula Scaption, focusing on proper movement and good control Internal and External rotation utilizing a theraband, with elbow tucked at side entire time Rows with theraband    Proper technique shown and discussed handout in great detail with ATC.  All questions were discussed and answered.       Impression and Recommendations:    The above documentation has been reviewed and is accurate and complete , DO

## 2021-07-15 ENCOUNTER — Encounter: Payer: Self-pay | Admitting: Family Medicine

## 2021-07-15 ENCOUNTER — Ambulatory Visit: Payer: BC Managed Care – PPO | Admitting: Family Medicine

## 2021-07-15 ENCOUNTER — Ambulatory Visit (INDEPENDENT_AMBULATORY_CARE_PROVIDER_SITE_OTHER): Payer: BC Managed Care – PPO

## 2021-07-15 VITALS — BP 116/86 | HR 91 | Ht 70.0 in

## 2021-07-15 DIAGNOSIS — M542 Cervicalgia: Secondary | ICD-10-CM

## 2021-07-15 DIAGNOSIS — M545 Low back pain, unspecified: Secondary | ICD-10-CM | POA: Diagnosis not present

## 2021-07-15 DIAGNOSIS — M79604 Pain in right leg: Secondary | ICD-10-CM

## 2021-07-15 DIAGNOSIS — M5030 Other cervical disc degeneration, unspecified cervical region: Secondary | ICD-10-CM | POA: Diagnosis not present

## 2021-07-15 MED ORDER — GABAPENTIN 100 MG PO CAPS: 200.0000 mg | ORAL_CAPSULE | Freq: Every day | ORAL | 3 refills | Status: DC

## 2021-07-15 NOTE — Patient Instructions (Addendum)
Xray today Scapular exercises can help Gabapentin refilled See me in 6-8 weeks

## 2021-07-15 NOTE — Assessment & Plan Note (Signed)
Likely cervical radiculopathy.  Discussed icing regimen and home exercise, which activities to do this activity slowly.  Patient given the activities and I think will be beneficial.  Follow-up with me again in 6 to 8 weeks new problem overall.

## 2021-07-15 NOTE — Assessment & Plan Note (Signed)
Patient likely is not having any radicular symptoms.  Negative straight leg test.  Refilled gabapentin.  Can continue with conservative therapy and we will refill up to 1 year

## 2021-08-19 ENCOUNTER — Encounter: Payer: Self-pay | Admitting: *Deleted

## 2021-08-19 DIAGNOSIS — Z006 Encounter for examination for normal comparison and control in clinical research program: Secondary | ICD-10-CM

## 2021-08-19 NOTE — Research (Signed)
I called patient to see if he was interested in [employer]. I left message for patient to call me if he is interested in the study.

## 2021-10-03 ENCOUNTER — Telehealth: Payer: Self-pay | Admitting: *Deleted

## 2021-10-03 DIAGNOSIS — Z006 Encounter for examination for normal comparison and control in clinical research program: Secondary | ICD-10-CM

## 2021-10-03 NOTE — Telephone Encounter (Signed)
I called patient to explain Steven Goodwin Prevention study. I left message for patient to call me if interested in the study.

## 2021-10-15 ENCOUNTER — Ambulatory Visit: Payer: BC Managed Care – PPO | Admitting: Family Medicine

## 2021-10-23 LAB — HEPATIC FUNCTION PANEL
ALT: 12 IU/L (ref 0–44)
AST: 15 IU/L (ref 0–40)
Albumin: 4.3 g/dL (ref 3.9–4.9)
Alkaline Phosphatase: 78 IU/L (ref 44–121)
Bilirubin Total: 0.5 mg/dL (ref 0.0–1.2)
Bilirubin, Direct: 0.15 mg/dL (ref 0.00–0.40)
Total Protein: 7.1 g/dL (ref 6.0–8.5)

## 2021-10-23 LAB — LIPID PANEL
Chol/HDL Ratio: 3.5 ratio (ref 0.0–5.0)
Cholesterol, Total: 150 mg/dL (ref 100–199)
HDL: 43 mg/dL (ref >39)
LDL Chol Calc (NIH): 93 mg/dL (ref 0–99)
Triglycerides: 68 mg/dL (ref 0–149)
VLDL Cholesterol Cal: 14 mg/dL (ref 5–40)

## 2021-11-06 ENCOUNTER — Telehealth: Payer: Self-pay | Admitting: *Deleted

## 2021-11-06 MED ORDER — ROSUVASTATIN CALCIUM 40 MG PO TABS
40.0000 mg | ORAL_TABLET | Freq: Every day | ORAL | 3 refills | Status: AC
Start: 2021-11-06 — End: ?

## 2021-11-06 NOTE — Telephone Encounter (Signed)
Reviewed via mychart   Sent new prescription to pharmacy for Rosuvastatin 40 mg daily

## 2021-11-06 NOTE — Telephone Encounter (Signed)
-----   Message from Leonie Man, MD sent at 10/30/2021  9:48 PM EDT ----- Seen, LDL did not change at all with the addition of rosuvastatin.  Need to see if he is is taking rosuvastatin, if so I would like to see if he can tolerate taking 40 mg and reassess labs in 3 months.  If necessary: Rx rosuvastatin 40 mg p.o. daily.  Take 1 tablet daily; dispense 90 tabs with 3 refills.   Glenetta Hew, MD

## 2022-04-21 ENCOUNTER — Other Ambulatory Visit: Payer: Self-pay | Admitting: Family Medicine

## 2022-07-28 NOTE — Progress Notes (Signed)
[name] [address] [place] [address] Phone: [phone] Subjective:    I'm seeing this patient by the request  of:  No primary care provider on file.  CC:   [id number]  07/15/2021 Likely cervical radiculopathy. Discussed icing regimen and home exercise, which activities to do this activity slowly. Patient given the activities and I think will be beneficial. Follow-up with me again in 6 to 8 weeks new problem overall.   Patient likely is not having any radicular symptoms. Negative straight leg test. Refilled gabapentin. Can continue with conservative therapy and we will refill up to 1 year   Updated 07/30/2022 Steven Goodwin is a 67 y.o. male coming in with complaint of LBP and neck pain       Past Medical History:  Diagnosis Date   Exercise-induced asthma 11/01/2017   History of chicken pox    History of shingles    Hyperglycemia    Hyperlipidemia 11/01/2017   Past Surgical History:  Procedure Laterality Date   TONSILLECTOMY     childhood   TRANSTHORACIC ECHOCARDIOGRAM  12/06/2020   20EF 50 to 55%.  GR 1 DD.  No RWM A.  Normal valves.   Social History   Socioeconomic History   Marital status: Married    Spouse name: Not on file   Number of children: Not on file   Years of education: Not on file   Highest education level: Not on file  Occupational History   Not on file  Tobacco Use   Smoking status: Never   Smokeless tobacco: Never  Substance and Sexual Activity   Alcohol use: Not Currently   Drug use: Never   Sexual activity: Yes  Other Topics Concern   Not on file  Social History Narrative      He is working at [employer]. Pt states not exercising regular but is doing yard work. Pt never smoked. No alcohol use. Pt drinks 3 sodas/pepsi a day.        Social Determinants of Health   Financial Resource Strain: Not on file  Food Insecurity: Not on file  Transportation Needs: Not on file  Physical Activity:  Not on file  Stress: Not on file  Social Connections: Not on file   No Known Allergies Family History  Problem Relation Age of Onset   Heart failure Mother    Heart failure Father    Heart disease Father        Had pacemaker   Mental illness Sister    Coronary artery disease Brother 82       Four-vessel CABG     Current Outpatient Medications (Cardiovascular):    rosuvastatin (CRESTOR) 40 MG tablet, Take 1 tablet (40 mg total) by mouth daily.   Current Outpatient Medications (Analgesics):    aspirin EC 81 MG tablet, Take 1 tablet (81 mg total) by mouth daily.   Current Outpatient Medications (Other):    COVID-19 mRNA bivalent vaccine, Moderna, (MODERNA COVID-19 BIVAL BOOSTER) 50 MCG/0.5ML injection, Inject into the muscle.   gabapentin (NEURONTIN) 100 MG capsule, TAKE TWO CAPSULES BY MOUTH NIGHTLY AT BEDTIME   influenza vaccine adjuvanted (FLUAD) 0.5 ML injection, Inject into the muscle.   Reviewed prior external information including notes and imaging from  primary care provider As well as notes that were available from care everywhere and other healthcare systems.  Past medical history, social, surgical and family history all reviewed in electronic medical record.  No pertanent information unless stated  regarding to the chief complaint.   Review of Systems:  No headache, visual changes, nausea, vomiting, diarrhea, constipation, dizziness, abdominal pain, skin rash, fevers, chills, night sweats, weight loss, swollen lymph nodes, body aches, joint swelling, chest pain, shortness of breath, mood changes. POSITIVE muscle aches  Objective  There were no vitals taken for this visit.   General: No apparent distress alert and oriented x3 mood and affect normal, dressed appropriately.  HEENT: Pupils equal, extraocular movements intact  Respiratory: Patient's speak in full sentences and does not appear short of breath  Cardiovascular: No lower extremity edema, non tender, no  erythema      Impression and Recommendations:

## 2022-07-30 ENCOUNTER — Telehealth (INDEPENDENT_AMBULATORY_CARE_PROVIDER_SITE_OTHER): Payer: Medicare Other | Admitting: Family Medicine

## 2022-07-30 ENCOUNTER — Encounter: Payer: Self-pay | Admitting: Family Medicine

## 2022-07-30 DIAGNOSIS — M5030 Other cervical disc degeneration, unspecified cervical region: Secondary | ICD-10-CM | POA: Diagnosis not present

## 2022-07-30 DIAGNOSIS — M79604 Pain in right leg: Secondary | ICD-10-CM | POA: Diagnosis not present

## 2022-07-30 DIAGNOSIS — M545 Low back pain, unspecified: Secondary | ICD-10-CM | POA: Diagnosis not present

## 2022-07-30 MED ORDER — GABAPENTIN 100 MG PO CAPS: ORAL_CAPSULE | ORAL | 0 refills | Status: AC

## 2024-05-30 IMAGING — DX DG CERVICAL SPINE 2 OR 3 VIEWS
3 series · 3 of 3 positions shown · non-contrast
Comparison: None Available.

CLINICAL DATA: Neck pain

EXAM:
CERVICAL SPINE - 2-3 VIEW

[c-spine lat]
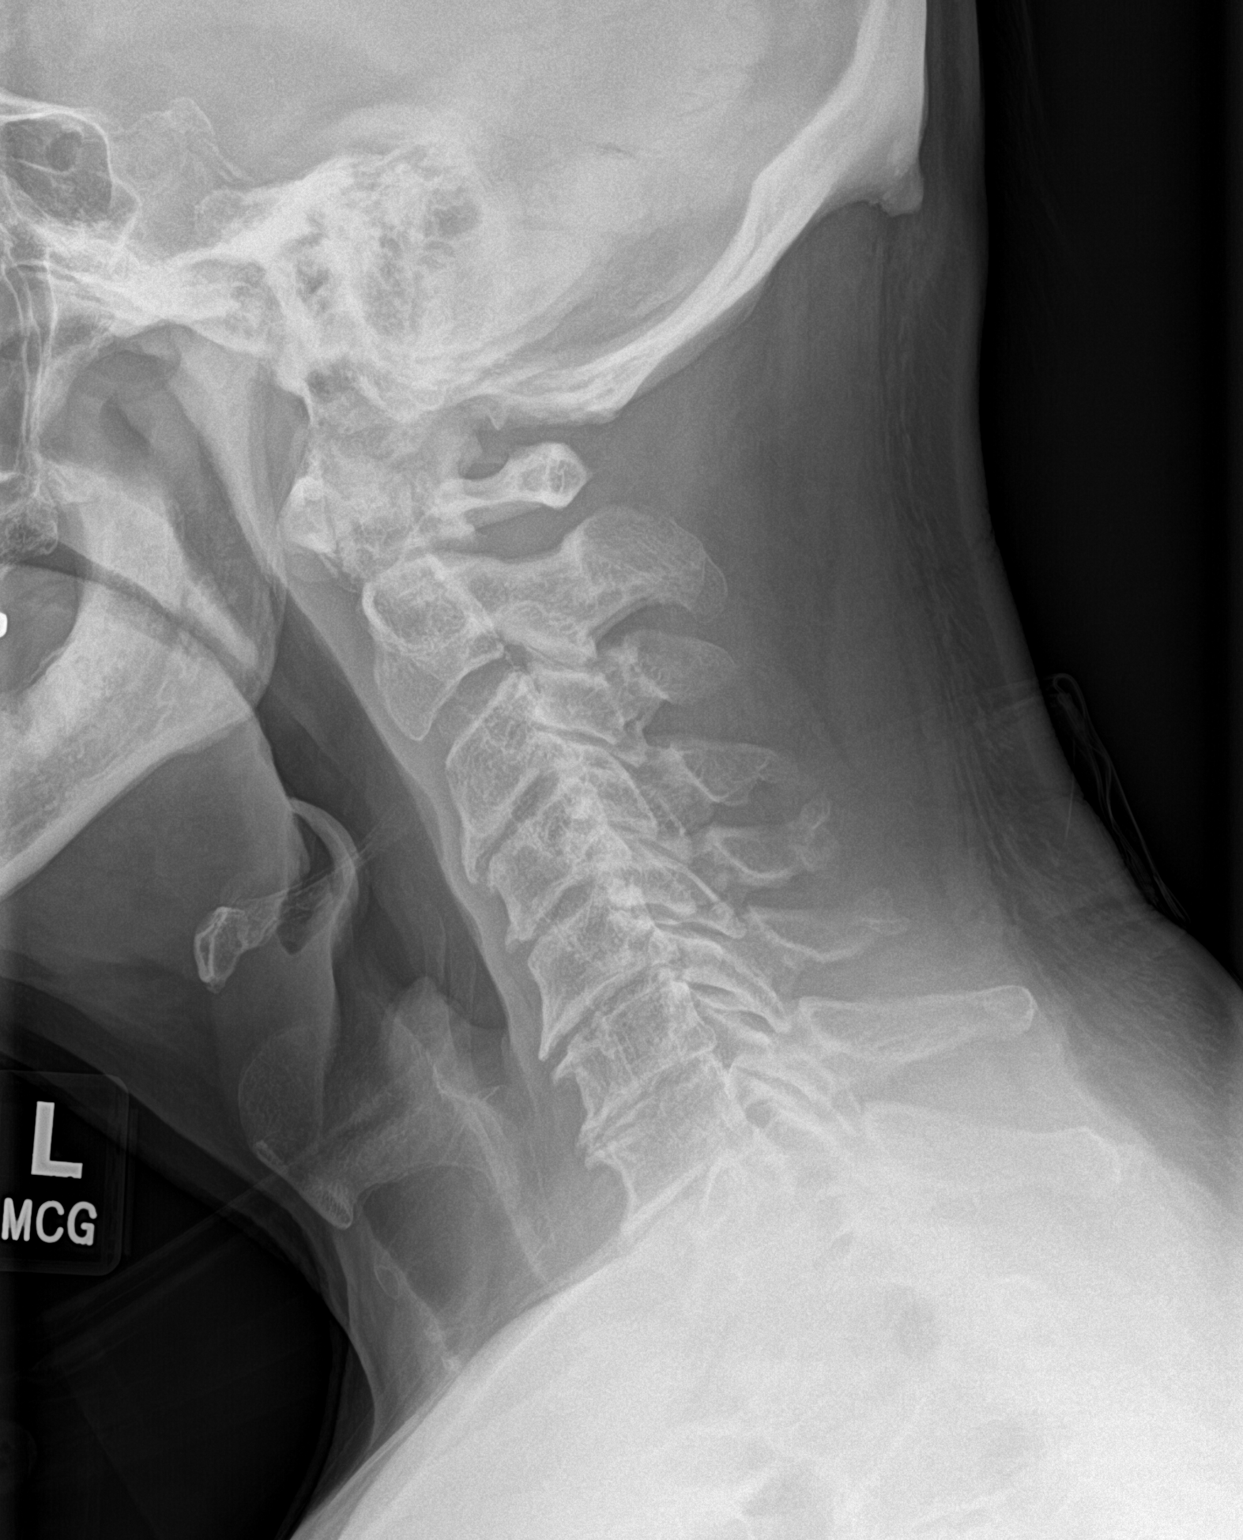

[c-spine ap]
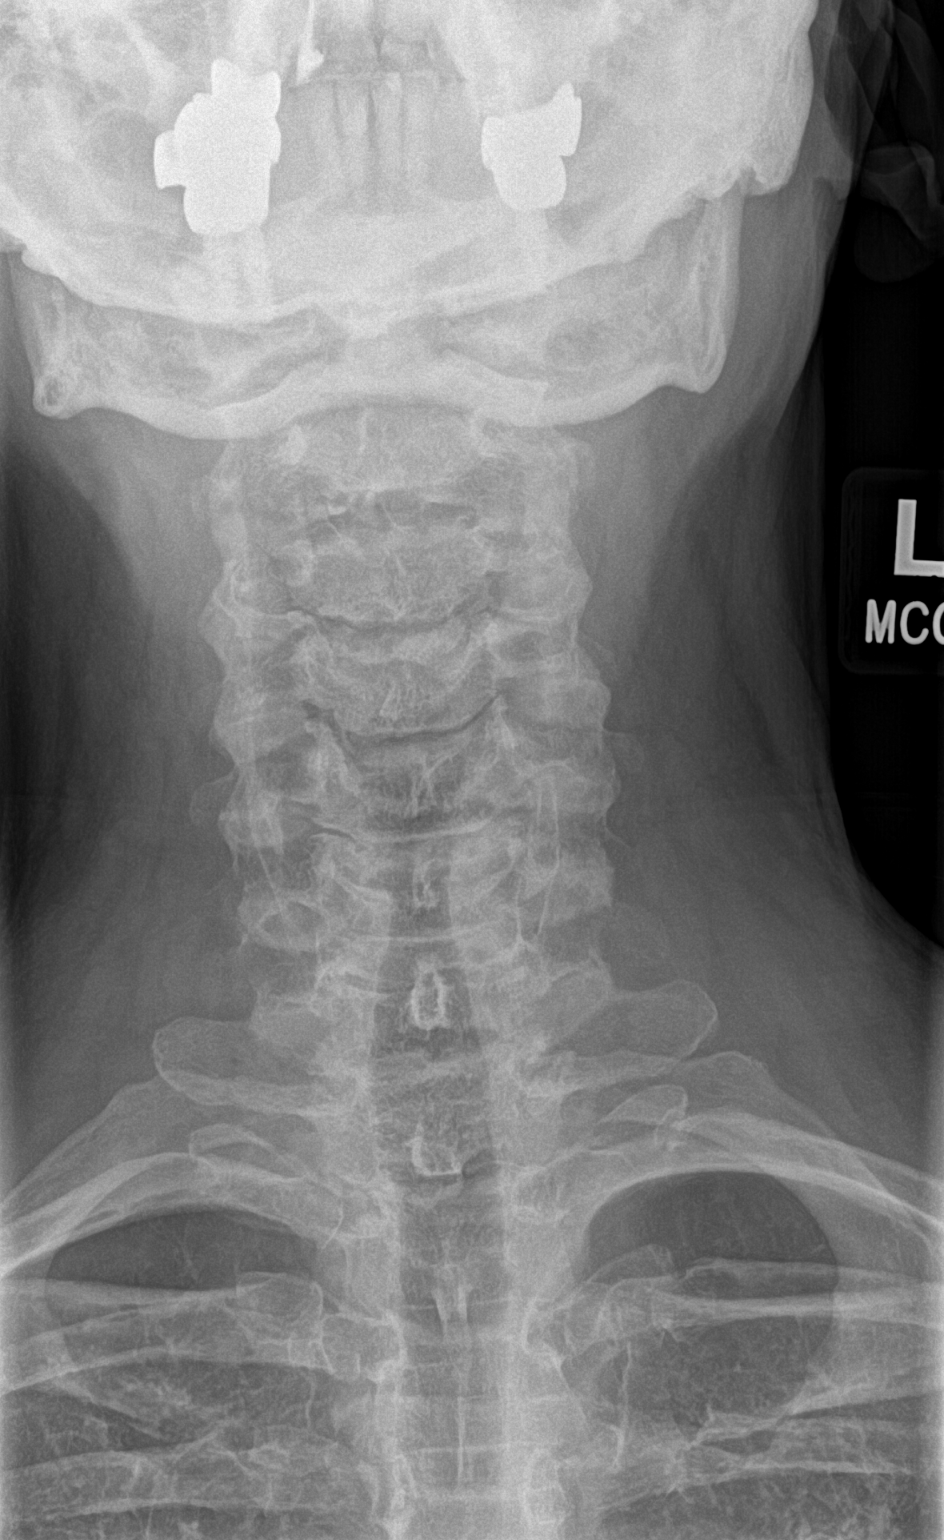

[c-spine open mouth]
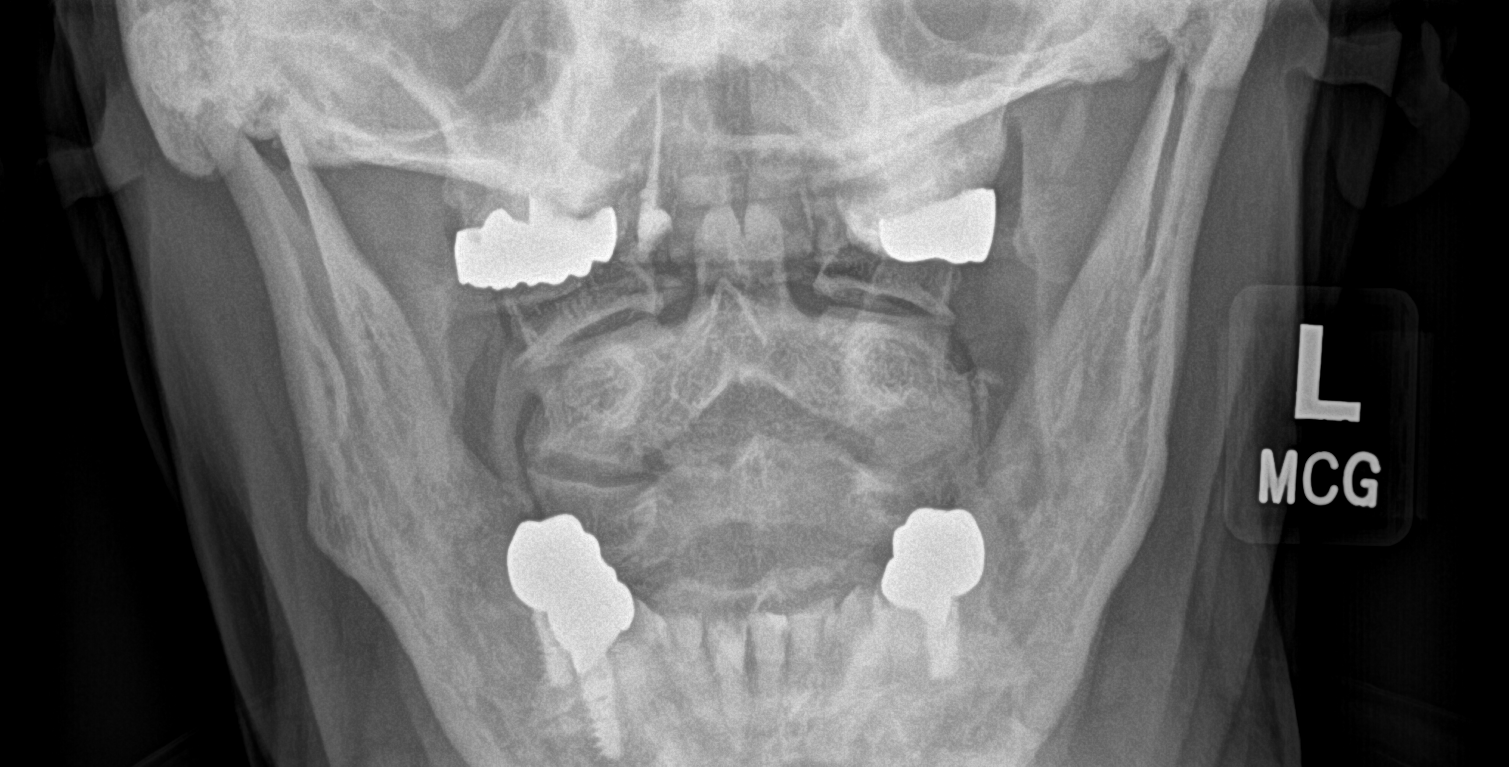

[3 of 3 positions shown; findings below may reference images not displayed]

FINDINGS: No acute fracture or malalignment identified. Multilevel moderate
intervertebral disc space narrowing from C3 through C7. No
prevertebral soft tissue swelling.
IMPRESSION: Degenerative changes with no acute fracture or malalignment
identified.
